# Patient Record
Sex: Female | Born: 1980 | Race: Black or African American | Hispanic: No | Marital: Married | Smoking: Never smoker
Health system: Southern US, Community
[De-identification: ages and names within clinical notes are randomized; demographics above are authoritative.]

## PROBLEM LIST (undated history)

## (undated) ENCOUNTER — Inpatient Hospital Stay (HOSPITAL_COMMUNITY): Payer: Self-pay

## (undated) DIAGNOSIS — Z789 Other specified health status: Secondary | ICD-10-CM

## (undated) HISTORY — PX: NO PAST SURGERIES: SHX2092

## (undated) HISTORY — PX: WISDOM TOOTH EXTRACTION: SHX21

---

## 2002-06-14 ENCOUNTER — Other Ambulatory Visit: Admission: RE | Admit: 2002-06-14 | Discharge: 2002-06-14 | Payer: Self-pay | Admitting: Obstetrics & Gynecology

## 2002-06-16 ENCOUNTER — Encounter: Payer: Self-pay | Admitting: Obstetrics & Gynecology

## 2002-06-16 ENCOUNTER — Ambulatory Visit (HOSPITAL_COMMUNITY): Admission: RE | Admit: 2002-06-16 | Discharge: 2002-06-16 | Payer: Self-pay | Admitting: Obstetrics & Gynecology

## 2002-09-06 ENCOUNTER — Encounter: Payer: Self-pay | Admitting: Obstetrics & Gynecology

## 2002-09-06 ENCOUNTER — Ambulatory Visit (HOSPITAL_COMMUNITY): Admission: RE | Admit: 2002-09-06 | Discharge: 2002-09-06 | Payer: Self-pay | Admitting: Obstetrics & Gynecology

## 2002-11-03 ENCOUNTER — Encounter: Payer: Self-pay | Admitting: Obstetrics

## 2002-11-03 ENCOUNTER — Inpatient Hospital Stay (HOSPITAL_COMMUNITY): Admission: RE | Admit: 2002-11-03 | Discharge: 2002-11-03 | Payer: Self-pay | Admitting: Obstetrics

## 2002-11-06 ENCOUNTER — Inpatient Hospital Stay (HOSPITAL_COMMUNITY): Admission: AD | Admit: 2002-11-06 | Discharge: 2002-11-09 | Payer: Self-pay | Admitting: *Deleted

## 2004-02-17 ENCOUNTER — Ambulatory Visit (HOSPITAL_COMMUNITY): Admission: RE | Admit: 2004-02-17 | Discharge: 2004-02-17 | Payer: Self-pay | Admitting: Obstetrics & Gynecology

## 2004-03-03 ENCOUNTER — Inpatient Hospital Stay (HOSPITAL_COMMUNITY): Admission: AD | Admit: 2004-03-03 | Discharge: 2004-03-04 | Payer: Self-pay | Admitting: Maternal and Fetal Medicine

## 2004-05-11 ENCOUNTER — Ambulatory Visit (HOSPITAL_COMMUNITY): Admission: RE | Admit: 2004-05-11 | Discharge: 2004-05-11 | Payer: Self-pay | Admitting: Obstetrics & Gynecology

## 2004-09-21 ENCOUNTER — Inpatient Hospital Stay (HOSPITAL_COMMUNITY): Admission: AD | Admit: 2004-09-21 | Discharge: 2004-09-23 | Payer: Self-pay | Admitting: Obstetrics & Gynecology

## 2004-09-23 ENCOUNTER — Inpatient Hospital Stay (HOSPITAL_COMMUNITY): Admission: AD | Admit: 2004-09-23 | Discharge: 2004-09-26 | Payer: Self-pay | Admitting: Obstetrics & Gynecology

## 2005-06-17 ENCOUNTER — Ambulatory Visit (HOSPITAL_COMMUNITY): Admission: RE | Admit: 2005-06-17 | Discharge: 2005-06-17 | Payer: Self-pay | Admitting: Obstetrics & Gynecology

## 2005-06-28 ENCOUNTER — Ambulatory Visit (HOSPITAL_COMMUNITY): Admission: RE | Admit: 2005-06-28 | Discharge: 2005-06-28 | Payer: Self-pay | Admitting: Obstetrics & Gynecology

## 2005-09-24 ENCOUNTER — Ambulatory Visit (HOSPITAL_COMMUNITY): Admission: RE | Admit: 2005-09-24 | Discharge: 2005-09-24 | Payer: Self-pay | Admitting: Obstetrics & Gynecology

## 2006-02-22 ENCOUNTER — Inpatient Hospital Stay (HOSPITAL_COMMUNITY): Admission: AD | Admit: 2006-02-22 | Discharge: 2006-02-24 | Payer: Self-pay | Admitting: Obstetrics and Gynecology

## 2006-10-17 ENCOUNTER — Emergency Department (HOSPITAL_COMMUNITY): Admission: EM | Admit: 2006-10-17 | Discharge: 2006-10-17 | Payer: Self-pay | Admitting: Emergency Medicine

## 2011-12-03 NOTE — L&D Delivery Note (Signed)
Delivery Note At 5:42 AM a viable female was delivered via Vaginal, Spontaneous Delivery (Presentation: Left Occiput Anterior).  APGAR: 5, 8; weight .   Placenta status: Intact, Spontaneous.  Cord: 3 vessels with the following complications: None.  Cord pH: none  Anesthesia: None  Episiotomy: None Lacerations: None Suture Repair: none Est. Blood Loss (mL): 300  Mom to postpartum.  Baby to nursery-stable.  HARPER,CHARLES A 10/28/2012, 6:06 AM

## 2012-03-18 ENCOUNTER — Inpatient Hospital Stay (HOSPITAL_COMMUNITY)
Admission: AD | Admit: 2012-03-18 | Discharge: 2012-03-18 | Disposition: A | Discharge status: Home / Self Care | Payer: Medicaid Other | Source: Ambulatory Visit | Attending: Family Medicine | Admitting: Family Medicine

## 2012-03-18 ENCOUNTER — Encounter (HOSPITAL_COMMUNITY): Payer: Self-pay | Admitting: *Deleted

## 2012-03-18 DIAGNOSIS — O210 Mild hyperemesis gravidarum: Secondary | ICD-10-CM | POA: Insufficient documentation

## 2012-03-18 DIAGNOSIS — R112 Nausea with vomiting, unspecified: Secondary | ICD-10-CM

## 2012-03-18 LAB — COMPREHENSIVE METABOLIC PANEL
AST: 14 U/L (ref 0–37)
Albumin: 4 g/dL (ref 3.5–5.2)
Calcium: 10.1 mg/dL (ref 8.4–10.5)
Chloride: 102 mEq/L (ref 96–112)
Creatinine, Ser: 0.77 mg/dL (ref 0.50–1.10)
Sodium: 136 mEq/L (ref 135–145)
Total Bilirubin: 0.9 mg/dL (ref 0.3–1.2)

## 2012-03-18 LAB — CBC
MCV: 84.2 fL (ref 78.0–100.0)
Platelets: 243 10*3/uL (ref 150–400)
RDW: 12.6 % (ref 11.5–15.5)
WBC: 11.2 10*3/uL — ABNORMAL HIGH (ref 4.0–10.5)

## 2012-03-18 LAB — URINALYSIS, ROUTINE W REFLEX MICROSCOPIC
Nitrite: NEGATIVE
Protein, ur: NEGATIVE mg/dL
Urobilinogen, UA: 0.2 mg/dL (ref 0.0–1.0)

## 2012-03-18 LAB — WET PREP, GENITAL
Clue Cells Wet Prep HPF POC: NONE SEEN
Trich, Wet Prep: NONE SEEN

## 2012-03-18 LAB — POCT PREGNANCY, URINE: Preg Test, Ur: POSITIVE — AB

## 2012-03-18 MED ORDER — PROMETHAZINE HCL 25 MG/ML IJ SOLN
25.0000 mg | Freq: Once | INTRAMUSCULAR | Status: AC
  Administered 2012-03-18: 25 mg via INTRAVENOUS
  Filled 2012-03-18: qty 1

## 2012-03-18 MED ORDER — PROMETHAZINE HCL 25 MG PO TABS: 25.0000 mg | ORAL_TABLET | Freq: Four times a day (QID) | ORAL | Status: DC | PRN

## 2012-03-18 MED ORDER — LACTATED RINGERS IV BOLUS (SEPSIS)
1000.0000 mL | Freq: Once | INTRAVENOUS | Status: AC
  Administered 2012-03-18: 1000 mL via INTRAVENOUS

## 2012-03-18 NOTE — MAU Note (Signed)
Pregnancy confirmed at [facility] for medicaid. States vomiting 3 X per day. Spitting into a bottle. States she had vomiting with previous pregnancies. States she noted some blood in vomitus last night. States she has had a scant amount of mucous vaginal discharge with streaks of blood only when she wipes. States she has lost weight. Has not been able to keep anything down today.

## 2012-03-18 NOTE — MAU Provider Note (Signed)
History   Pt presents today c/o N&V that has worsened over the past couple of days. She states she has no longer been able to keep anything on her stomach. She also noticed a streak of blood in her vag dc this am.  CSN: [id number]  Arrival date and time: 03/18/12 1641   None     Chief Complaint  Patient presents with  . Emesis During Pregnancy   HPI  OB History    Grav Para Term Preterm Abortions TAB SAB Ect Mult Living   4 3 3  0 0 0 0 0 0 3      History reviewed. No pertinent past medical history.  Past Surgical History  Procedure Date  . Wisdom tooth extraction     Family History  Problem Relation Age of Onset  . Anesthesia problems Neg Hx     History  Substance Use Topics  . Smoking status: Never Smoker   . Smokeless tobacco: Never Used  . Alcohol Use: No    Allergies: No Known Allergies  No prescriptions prior to admission    Review of Systems  Constitutional: Negative for fever and chills.  Eyes: Negative for blurred vision and double vision.  Respiratory: Negative for cough, hemoptysis, sputum production, shortness of breath and wheezing.   Cardiovascular: Negative for chest pain and palpitations.  Gastrointestinal: Positive for nausea and vomiting. Negative for abdominal pain, diarrhea and constipation.  Genitourinary: Negative for dysuria, urgency, frequency and hematuria.  Neurological: Negative for dizziness and headaches.  Psychiatric/Behavioral: Negative for depression and suicidal ideas.   Physical Exam   Blood pressure 120/64, pulse 58, temperature 98.4 F (36.9 C), temperature source Oral, resp. rate 18, height 5\' 3"  (1.6 m), weight 133 lb (60.328 kg), last menstrual period 01/17/2012.  Physical Exam  Nursing note and vitals reviewed. Constitutional: She is oriented to person, place, and time. She appears well-developed and well-nourished. No distress.  HENT:  Head: Normocephalic and atraumatic.  Eyes: EOM are normal. Pupils are equal,  round, and reactive to light.  Cardiovascular: Normal rate and regular rhythm.  Exam reveals no gallop and no friction rub.   No murmur heard. Respiratory: Effort normal and breath sounds normal. No respiratory distress. She has no wheezes. She has no rales. She exhibits no tenderness.  GI: Soft. She exhibits no distension and no mass. There is no tenderness. There is no rebound and no guarding.  Genitourinary: No bleeding around the vagina. No vaginal discharge found.       Cervix Lg/closed.  Neurological: She is alert and oriented to person, place, and time.  Skin: Skin is warm and dry. She is not diaphoretic.  Psychiatric: She has a normal mood and affect. Her behavior is normal. Judgment and thought content normal.    MAU Course  Procedures  Wet prep and GC/Chlamydia cultures done.  Results for orders placed during the hospital encounter of 03/18/12 (from the past 24 hour(s))  URINALYSIS, ROUTINE W REFLEX MICROSCOPIC     Status: Abnormal   Collection Time   03/18/12  4:50 PM      Component Value Range   Color, Urine YELLOW  YELLOW    APPearance HAZY (*) CLEAR    Specific Gravity, Urine <1.005 (*) 1.005 - 1.030    pH 6.5  5.0 - 8.0    Glucose, UA NEGATIVE  NEGATIVE (mg/dL)   Hgb urine dipstick TRACE (*) NEGATIVE    Bilirubin Urine NEGATIVE  NEGATIVE    Ketones, ur NEGATIVE  NEGATIVE (  mg/dL)   Protein, ur NEGATIVE  NEGATIVE (mg/dL)   Urobilinogen, UA 0.2  0.0 - 1.0 (mg/dL)   Nitrite NEGATIVE  NEGATIVE    Leukocytes, UA NEGATIVE  NEGATIVE   URINE MICROSCOPIC-ADD ON     Status: Abnormal   Collection Time   03/18/12  4:50 PM      Component Value Range   Squamous Epithelial / LPF FEW (*) RARE    WBC, UA 0-2  <3 (WBC/hpf)   RBC / HPF 0-2  <3 (RBC/hpf)   Bacteria, UA FEW (*) RARE   POCT PREGNANCY, URINE     Status: Abnormal   Collection Time   03/18/12  5:21 PM      Component Value Range   Preg Test, Ur POSITIVE (*) NEGATIVE   CBC     Status: Abnormal   Collection Time    03/18/12  5:30 PM      Component Value Range   WBC 11.2 (*) 4.0 - 10.5 (K/uL)   RBC 4.38  3.87 - 5.11 (MIL/uL)   Hemoglobin 13.3  12.0 - 15.0 (g/dL)   HCT 29.9  37.1 - 69.6 (%)   MCV 84.2  78.0 - 100.0 (fL)   MCH 30.4  26.0 - 34.0 (pg)   MCHC 36.0  30.0 - 36.0 (g/dL)   RDW 78.9  38.1 - 01.7 (%)   Platelets 243  150 - 400 (K/uL)  COMPREHENSIVE METABOLIC PANEL     Status: Normal   Collection Time   03/18/12  5:30 PM      Component Value Range   Sodium 136  135 - 145 (mEq/L)   Potassium 3.5  3.5 - 5.1 (mEq/L)   Chloride 102  96 - 112 (mEq/L)   CO2 24  19 - 32 (mEq/L)   Glucose, Bld 85  70 - 99 (mg/dL)   BUN 10  6 - 23 (mg/dL)   Creatinine, Ser 5.10  0.50 - 1.10 (mg/dL)   Calcium 25.8  8.4 - 10.5 (mg/dL)   Total Protein 7.4  6.0 - 8.3 (g/dL)   Albumin 4.0  3.5 - 5.2 (g/dL)   AST 14  0 - 37 (U/L)   ALT 11  0 - 35 (U/L)   Alkaline Phosphatase 74  39 - 117 (U/L)   Total Bilirubin 0.9  0.3 - 1.2 (mg/dL)   GFR calc non Af Amer >90  >90 (mL/min)   GFR calc Af Amer >90  >90 (mL/min)  ABO/RH     Status: Normal (Preliminary result)   Collection Time   03/18/12  5:30 PM      Component Value Range   ABO/RH(D) O POS    WET PREP, GENITAL     Status: Abnormal   Collection Time   03/18/12  6:55 PM      Component Value Range   Yeast Wet Prep HPF POC NONE SEEN  NONE SEEN    Trich, Wet Prep NONE SEEN  NONE SEEN    Clue Cells Wet Prep HPF POC NONE SEEN  NONE SEEN    WBC, Wet Prep HPF POC FEW (*) NONE SEEN    Bedside US shows single IUP with cardiac activity at 8.4wks.  Assessment and Plan  N&V: discussed with pt at length. Will dc to home with phenergan. Discussed diet, activity, risks, and precautions. She will begin prenatal care.  Clinton Gallant. Hser Belanger III, DrHSc, MPAS, PA-C  03/18/2012, 5:31 PM

## 2012-03-19 LAB — GC/CHLAMYDIA PROBE AMP, GENITAL
Chlamydia, DNA Probe: NEGATIVE
GC Probe Amp, Genital: NEGATIVE

## 2012-03-19 NOTE — MAU Provider Note (Signed)
Chart reviewed and agree with management and plan.  

## 2012-04-08 LAB — OB RESULTS CONSOLE ABO/RH: RH Type: POSITIVE

## 2012-04-08 LAB — OB RESULTS CONSOLE RPR: RPR: NONREACTIVE

## 2012-04-08 LAB — OB RESULTS CONSOLE RUBELLA ANTIBODY, IGM: Rubella: IMMUNE

## 2012-04-08 LAB — OB RESULTS CONSOLE HIV ANTIBODY (ROUTINE TESTING): HIV: NONREACTIVE

## 2012-04-08 LAB — OB RESULTS CONSOLE HEPATITIS B SURFACE ANTIGEN: Hepatitis B Surface Ag: NEGATIVE

## 2012-05-06 ENCOUNTER — Other Ambulatory Visit: Payer: Self-pay

## 2012-05-20 ENCOUNTER — Encounter (HOSPITAL_COMMUNITY): Payer: Self-pay | Admitting: Obstetrics

## 2012-05-25 ENCOUNTER — Other Ambulatory Visit: Payer: Self-pay | Admitting: Obstetrics

## 2012-05-25 DIAGNOSIS — Z8279 Family history of other congenital malformations, deformations and chromosomal abnormalities: Secondary | ICD-10-CM

## 2012-05-26 ENCOUNTER — Ambulatory Visit (HOSPITAL_COMMUNITY)
Admission: RE | Admit: 2012-05-26 | Discharge: 2012-05-26 | Disposition: A | Discharge status: Home / Self Care | Payer: Medicaid Other | Source: Ambulatory Visit | Attending: Obstetrics | Admitting: Obstetrics

## 2012-05-26 ENCOUNTER — Encounter (HOSPITAL_COMMUNITY): Payer: Self-pay

## 2012-05-26 DIAGNOSIS — Z8279 Family history of other congenital malformations, deformations and chromosomal abnormalities: Secondary | ICD-10-CM

## 2012-05-26 DIAGNOSIS — Z363 Encounter for antenatal screening for malformations: Secondary | ICD-10-CM | POA: Insufficient documentation

## 2012-05-26 DIAGNOSIS — D573 Sickle-cell trait: Secondary | ICD-10-CM | POA: Insufficient documentation

## 2012-05-26 DIAGNOSIS — Z1389 Encounter for screening for other disorder: Secondary | ICD-10-CM | POA: Insufficient documentation

## 2012-05-26 DIAGNOSIS — O358XX0 Maternal care for other (suspected) fetal abnormality and damage, not applicable or unspecified: Secondary | ICD-10-CM | POA: Insufficient documentation

## 2012-05-26 NOTE — Progress Notes (Signed)
Genetic Counseling  High-Risk Gestation Note  Appointment Date:  05/26/2012 Referred By: [name], MD Date of Birth:  1981-07-28    Pregnancy History: [id number] Estimated Date of Delivery: 10/23/12 Estimated Gestational Age: [redacted]w[redacted]d Attending: Particia Nearing, MD   Tonya Gray and her partner, Mr. Charleen Kirks, were seen for genetic counseling because of hemoglobin electrophoresis which indicated sickle cell trait in the patient and because of the couple's daughter born with a shortened ulna.   Ms. 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 Ms. Sease stated that all three of her children have sickle cell trait, detected from newborn screening, and that she also knew from testing in the past that she has sickle cell trait.   We discussed that sickle cell anemia (SCA) affects the shape and function of the red blood cell by producing abnormal hemoglobin. They were counseled that hemoglobin is a protein in the RBCs that carries oxygen to the body's organs. Individuals who have SCA have a changes within the genes that codes for hemoglobin. This couple was counseled that SCA is inherited in an autosomal recessive manner, and occurs when both copies of the hemoglobin gene are changed and produce an abnormal hemoglobin S. Typically, one abnormal gene for the production of hemoglobin S is inherited from each parent. A carrier of SCA has one altered copy of the gene for hemoglobin and one typical working copy. Carriers of recessive conditions typically do not have symptoms related to the condition because they still have one functioning copy of the gene, and thus some production of the typical protein coded for by that gene. We reviewed that when both parents are carriers, each pregnancy has a 25% chance to inherit SCA, 50% chance to inherit SCT, and a 25% chance to have neither SCA  or SCT.   Given the recessive inheritance, we discussed the importance of understanding Mr. McFarlane's carrier status in order to accurately predict the risk of a hemoglobinopathy in the fetus. Mr. Hubbard Robinson reported that he has not had testing for sickle cell trait in the past to his knowledge. However, he reported that his mother and maternal half-sister were told in the past that they have sickle cell trait. We discussed that prior to screening, Mr. Hubbard Robinson has a 1 in 2 chance to have sickle cell trait based on this reported family history, in which case the risk for SCA in the current pregnancy would be 1 in 8. (We discussed that if there is not a family history of sickle cell trait for Mr. Hubbard Robinson, then he would have the general population risk to carry SCT, prior to screening, in which case risk for SCA in the pregnancy would be approximately 1 in 79.)  Mr. Hubbard Robinson was offered the option of testing via hemoglobin electrophoresis to determine whether he has any hemoglobin variant, including SCT. We discussed that he can have this performed through our office, his primary physician, or [facility] [place], which provides sickle cell screening free of charge.  He declined this testing today, stating that he would likely pursue screening through [facility] [place].   We also reviewed the availability of newborn screening in [place] for hemoglobinopathies. We discussed the option of amniocentesis as a way to determine, prenatally, if a baby has SCA or not, in the case that both parents are identified carriers.  A risk  of 1 in 300-500 was given for amniocentesis, the primary complication being spontaneous pregnancy loss, or at this late gestation, preterm delivery.  The couple stated that they would not be interested in amniocentesis in the pregnancy to assess for SCA. She understands that although the ultrasound may appear normal, the risk of anomalies cannot be  completely eliminated, and that a baby with SCA would not appear different on a prenatal ultrasound.   Both family histories were reviewed and found to also be contributory for the couple's daughter born with her left ulna shortened. Ms. Klee reported that the left radius is growing over the ulna, or vice versa, she could not remember which, which is causing the bone to protrude near her elbow. The couple reported that their daughter has been evaluated at [facility] and at [facility], where she is currently followed. 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 We discussed that congenital bone abnormalities can be isolated or occur as part of an underlying genetic syndrome. Ms. Cumbo reported that no underlying cause was determined. We discussed that without additional information, we cannot accurately assess recurrence risk for relatives. We discussed that targeted ultrasound is available to assess fetal growth and development.  The couple understands that ultrasound cannot rule out all birth defects or genetic conditions. Without further information regarding the provided family history, an accurate genetic risk cannot be calculated. Further genetic counseling is warranted if more information is obtained.   Targeted ultrasound was performed at the time of today's visit. Complete ultrasound results reported separately.   Ms. Vannostrand denied exposure to environmental toxins or chemical agents. She denied the use of alcohol, tobacco or street drugs. She denied significant viral illnesses during the course of her pregnancy. Her medical and surgical histories were noncontributory.   I counseled this couple for approximately 35 minutes regarding the above risks and available options.     Quinn Plowman, MS,  Certified [employer]  05/28/2012

## 2012-05-27 ENCOUNTER — Encounter (HOSPITAL_COMMUNITY): Payer: Self-pay

## 2012-08-05 ENCOUNTER — Ambulatory Visit: Payer: Medicaid Other | Attending: Nurse Practitioner | Admitting: Physical Therapy

## 2012-08-05 DIAGNOSIS — M545 Low back pain, unspecified: Secondary | ICD-10-CM | POA: Insufficient documentation

## 2012-08-05 DIAGNOSIS — IMO0001 Reserved for inherently not codable concepts without codable children: Secondary | ICD-10-CM | POA: Insufficient documentation

## 2012-08-05 DIAGNOSIS — M25559 Pain in unspecified hip: Secondary | ICD-10-CM | POA: Insufficient documentation

## 2012-08-19 ENCOUNTER — Ambulatory Visit: Payer: Medicaid Other | Admitting: Physical Therapy

## 2012-09-02 ENCOUNTER — Ambulatory Visit: Payer: Medicaid Other | Attending: Nurse Practitioner | Admitting: Physical Therapy

## 2012-09-02 DIAGNOSIS — IMO0001 Reserved for inherently not codable concepts without codable children: Secondary | ICD-10-CM | POA: Insufficient documentation

## 2012-09-02 DIAGNOSIS — M545 Low back pain, unspecified: Secondary | ICD-10-CM | POA: Insufficient documentation

## 2012-09-02 DIAGNOSIS — M25559 Pain in unspecified hip: Secondary | ICD-10-CM | POA: Insufficient documentation

## 2012-09-15 ENCOUNTER — Ambulatory Visit: Payer: Medicaid Other | Admitting: Physical Therapy

## 2012-09-16 ENCOUNTER — Ambulatory Visit: Payer: Medicaid Other | Admitting: Physical Therapy

## 2012-09-16 ENCOUNTER — Encounter: Payer: Medicaid Other | Admitting: Physical Therapy

## 2012-10-20 ENCOUNTER — Telehealth (HOSPITAL_COMMUNITY): Payer: Self-pay | Admitting: *Deleted

## 2012-10-20 ENCOUNTER — Encounter (HOSPITAL_COMMUNITY): Payer: Self-pay | Admitting: *Deleted

## 2012-10-20 NOTE — Telephone Encounter (Signed)
Preadmission screen  

## 2012-10-22 ENCOUNTER — Other Ambulatory Visit: Payer: Self-pay | Admitting: Obstetrics & Gynecology

## 2012-10-22 ENCOUNTER — Other Ambulatory Visit: Payer: Self-pay | Admitting: Obstetrics

## 2012-10-22 ENCOUNTER — Telehealth (HOSPITAL_COMMUNITY): Payer: Self-pay | Admitting: *Deleted

## 2012-10-22 NOTE — Telephone Encounter (Signed)
Preadmission screen  

## 2012-10-27 ENCOUNTER — Encounter (HOSPITAL_COMMUNITY): Payer: Self-pay

## 2012-10-27 ENCOUNTER — Inpatient Hospital Stay (HOSPITAL_COMMUNITY)
Admission: RE | Admit: 2012-10-27 | Discharge: 2012-10-30 | DRG: 775 | Disposition: A | Discharge status: Home / Self Care | Payer: Medicaid Other | Source: Ambulatory Visit | Attending: Obstetrics | Admitting: Obstetrics

## 2012-10-27 DIAGNOSIS — D573 Sickle-cell trait: Secondary | ICD-10-CM

## 2012-10-27 DIAGNOSIS — O99892 Other specified diseases and conditions complicating childbirth: Secondary | ICD-10-CM | POA: Diagnosis present

## 2012-10-27 DIAGNOSIS — Z22330 Carrier of Group B streptococcus: Secondary | ICD-10-CM

## 2012-10-27 DIAGNOSIS — O480 Post-term pregnancy: Principal | ICD-10-CM | POA: Diagnosis present

## 2012-10-27 HISTORY — DX: Other specified health status: Z78.9

## 2012-10-27 LAB — CBC
HCT: 33 % — ABNORMAL LOW (ref 36.0–46.0)
MCH: 27.4 pg (ref 26.0–34.0)
MCHC: 34.2 g/dL (ref 30.0–36.0)
MCV: 79.9 fL (ref 78.0–100.0)
RDW: 15.4 % (ref 11.5–15.5)

## 2012-10-27 MED ORDER — LACTATED RINGERS IV SOLN
500.0000 mL | INTRAVENOUS | Status: DC | PRN
  Administered 2012-10-27: 500 mL via INTRAVENOUS

## 2012-10-27 MED ORDER — IBUPROFEN 600 MG PO TABS: 600.0000 mg | ORAL_TABLET | Freq: Four times a day (QID) | ORAL | Status: DC | PRN

## 2012-10-27 MED ORDER — PROMETHAZINE HCL 25 MG/ML IJ SOLN
25.0000 mg | Freq: Four times a day (QID) | INTRAMUSCULAR | Status: DC | PRN
  Administered 2012-10-28: 25 mg via INTRAMUSCULAR
  Filled 2012-10-27: qty 1

## 2012-10-27 MED ORDER — NALBUPHINE SYRINGE 5 MG/0.5 ML
10.0000 mg | INJECTION | Freq: Four times a day (QID) | INTRAMUSCULAR | Status: DC | PRN
  Administered 2012-10-28: 10 mg via INTRAMUSCULAR
  Filled 2012-10-27 (×2): qty 1

## 2012-10-27 MED ORDER — MISOPROSTOL 25 MCG QUARTER TABLET
25.0000 ug | ORAL_TABLET | ORAL | Status: DC | PRN
  Administered 2012-10-27: 25 ug via VAGINAL
  Filled 2012-10-27: qty 1

## 2012-10-27 MED ORDER — ZOLPIDEM TARTRATE 5 MG PO TABS
5.0000 mg | ORAL_TABLET | Freq: Every evening | ORAL | Status: DC | PRN
Start: 2012-10-27 — End: 2012-10-28
  Administered 2012-10-28: 5 mg via ORAL
  Filled 2012-10-27: qty 1

## 2012-10-27 MED ORDER — ACETAMINOPHEN 325 MG PO TABS: 650.0000 mg | ORAL_TABLET | ORAL | Status: DC | PRN

## 2012-10-27 MED ORDER — OXYTOCIN 40 UNITS IN LACTATED RINGERS INFUSION - SIMPLE MED
62.5000 mL/h | INTRAVENOUS | Status: DC
  Administered 2012-10-28: 62.5 mL/h via INTRAVENOUS
  Filled 2012-10-27: qty 1000

## 2012-10-27 MED ORDER — OXYTOCIN BOLUS FROM INFUSION
500.0000 mL | INTRAVENOUS | Status: DC
  Administered 2012-10-28: 500 mL via INTRAVENOUS

## 2012-10-27 MED ORDER — PENICILLIN G POTASSIUM 5000000 UNITS IJ SOLR
2.5000 10*6.[IU] | INTRAVENOUS | Status: DC
  Administered 2012-10-28: 2.5 10*6.[IU] via INTRAVENOUS
  Filled 2012-10-27 (×5): qty 2.5

## 2012-10-27 MED ORDER — LACTATED RINGERS IV SOLN
INTRAVENOUS | Status: DC
  Administered 2012-10-27: 20:00:00 via INTRAVENOUS

## 2012-10-27 MED ORDER — CITRIC ACID-SODIUM CITRATE 334-500 MG/5ML PO SOLN: 30.0000 mL | ORAL | Status: DC | PRN

## 2012-10-27 MED ORDER — ONDANSETRON HCL 4 MG/2ML IJ SOLN: 4.0000 mg | Freq: Four times a day (QID) | INTRAMUSCULAR | Status: DC | PRN

## 2012-10-27 MED ORDER — OXYCODONE-ACETAMINOPHEN 5-325 MG PO TABS: 1.0000 | ORAL_TABLET | ORAL | Status: DC | PRN

## 2012-10-27 MED ORDER — NALBUPHINE SYRINGE 5 MG/0.5 ML
10.0000 mg | INJECTION | INTRAMUSCULAR | Status: DC | PRN
  Administered 2012-10-28: 10 mg via INTRAVENOUS
  Filled 2012-10-27: qty 1

## 2012-10-27 MED ORDER — LIDOCAINE HCL (PF) 1 % IJ SOLN
30.0000 mL | INTRAMUSCULAR | Status: DC | PRN
  Filled 2012-10-27: qty 30

## 2012-10-27 MED ORDER — FLEET ENEMA 7-19 GM/118ML RE ENEM: 1.0000 | ENEMA | RECTAL | Status: DC | PRN

## 2012-10-27 MED ORDER — PENICILLIN G POTASSIUM 5000000 UNITS IJ SOLR
5.0000 10*6.[IU] | Freq: Once | INTRAVENOUS | Status: AC
  Administered 2012-10-27: 5 10*6.[IU] via INTRAVENOUS
  Filled 2012-10-27: qty 5

## 2012-10-27 NOTE — H&P (Signed)
Tonya Gray is a 31 y.o. female presenting for IOL. Maternal Medical History:  Reason for admission: 31 yo G4 P3.  EDC 10-23-12.  Presents for IOL for postdates.  Fetal activity: Perceived fetal activity is normal.   Last perceived fetal movement was within the past hour.    Prenatal Complications - Diabetes: none.    OB History    Grav Para Term Preterm Abortions TAB SAB Ect Mult Living   4 3 3  0 0 0 0 0 0 3     Past Medical History  Diagnosis Date  . No pertinent past medical history    Past Surgical History  Procedure Date  . Wisdom tooth extraction    Family History: family history includes Other in her daughter and Sickle cell trait in her daughters and son.  There is no history of Anesthesia problems. Social History:  reports that she has never smoked. She has never used smokeless tobacco. She reports that she does not drink alcohol or use illicit drugs.   Prenatal Transfer Tool  Maternal Diabetes: No Genetic Screening: Normal Maternal Ultrasounds/Referrals: Normal Fetal Ultrasounds or other Referrals:  None Maternal Substance Abuse:  No Significant Maternal Medications:  Meds include: Other: PNV Significant Maternal Lab Results:  Lab values include: Group B Strep negative Other Comments:  None  Review of Systems  All other systems reviewed and are negative.    Dilation: 1 Effacement (%): 50 Station: -3 Exam by:: ansah-mensah, rnc Blood pressure 138/76, pulse 77, temperature 98.2 F (36.8 C), temperature source Oral, resp. rate 20, height 5\' 2"  (1.575 m), weight 168 lb (76.204 kg), last menstrual period 01/17/2012. Maternal Exam:  Abdomen: Patient reports no abdominal tenderness. Fetal presentation: vertex  Introitus: Normal vulva. Normal vagina.    Physical Exam  Nursing note and vitals reviewed. Constitutional: She is oriented to person, place, and time. She appears well-developed and well-nourished.  HENT:  Head: Normocephalic and atraumatic.    Eyes: Conjunctivae normal are normal. Pupils are equal, round, and reactive to light.  Neck: Normal range of motion. Neck supple.  Cardiovascular: Normal rate and regular rhythm.   Respiratory: Effort normal.  GI: Soft.  Genitourinary: Vagina normal and uterus normal.  Musculoskeletal: Normal range of motion.  Neurological: She is alert and oriented to person, place, and time.  Skin: Skin is warm and dry.  Psychiatric: She has a normal mood and affect. Her behavior is normal. Judgment and thought content normal.    Prenatal labs: ABO, Rh: O/Positive/-- (05/08 0000) Antibody: Negative (05/08 0000) Rubella: Immune (05/08 0000) RPR: Nonreactive (05/08 0000)  HBsAg: Negative (05/08 0000)  HIV: Non-reactive (05/08 0000)  GBS: Positive (10/24 0000)   Assessment/Plan: 40.4 weeks.  Postdates.  2 stage IOL.   Tonya Gray A 10/27/2012, 10:14 PM

## 2012-10-28 ENCOUNTER — Encounter (HOSPITAL_COMMUNITY): Payer: Self-pay

## 2012-10-28 MED ORDER — DIPHENHYDRAMINE HCL 25 MG PO CAPS: 25.0000 mg | ORAL_CAPSULE | Freq: Four times a day (QID) | ORAL | Status: DC | PRN

## 2012-10-28 MED ORDER — LACTATED RINGERS IV SOLN: 500.0000 mL | Freq: Once | INTRAVENOUS | Status: DC

## 2012-10-28 MED ORDER — DIBUCAINE 1 % RE OINT: 1.0000 "application " | TOPICAL_OINTMENT | RECTAL | Status: DC | PRN

## 2012-10-28 MED ORDER — SENNOSIDES-DOCUSATE SODIUM 8.6-50 MG PO TABS
2.0000 | ORAL_TABLET | Freq: Every day | ORAL | Status: DC
  Administered 2012-10-28 – 2012-10-30 (×2): 2 via ORAL

## 2012-10-28 MED ORDER — EPHEDRINE 5 MG/ML INJ: 10.0000 mg | INTRAVENOUS | Status: DC | PRN

## 2012-10-28 MED ORDER — TETANUS-DIPHTH-ACELL PERTUSSIS 5-2.5-18.5 LF-MCG/0.5 IM SUSP: 0.5000 mL | Freq: Once | INTRAMUSCULAR | Status: DC

## 2012-10-28 MED ORDER — EPHEDRINE 5 MG/ML INJ
10.0000 mg | INTRAVENOUS | Status: DC | PRN
  Filled 2012-10-28: qty 4

## 2012-10-28 MED ORDER — PHENYLEPHRINE 40 MCG/ML (10ML) SYRINGE FOR IV PUSH (FOR BLOOD PRESSURE SUPPORT)
80.0000 ug | PREFILLED_SYRINGE | INTRAVENOUS | Status: DC | PRN
  Filled 2012-10-28: qty 5

## 2012-10-28 MED ORDER — PRENATAL MULTIVITAMIN CH
1.0000 | ORAL_TABLET | Freq: Every day | ORAL | Status: DC
  Administered 2012-10-28 – 2012-10-29 (×2): 1 via ORAL
  Filled 2012-10-28 (×3): qty 1

## 2012-10-28 MED ORDER — SIMETHICONE 80 MG PO CHEW: 80.0000 mg | CHEWABLE_TABLET | ORAL | Status: DC | PRN

## 2012-10-28 MED ORDER — LANOLIN HYDROUS EX OINT: TOPICAL_OINTMENT | CUTANEOUS | Status: DC | PRN

## 2012-10-28 MED ORDER — ONDANSETRON HCL 4 MG PO TABS: 4.0000 mg | ORAL_TABLET | ORAL | Status: DC | PRN

## 2012-10-28 MED ORDER — FENTANYL 2.5 MCG/ML BUPIVACAINE 1/10 % EPIDURAL INFUSION (WH - ANES)
14.0000 mL/h | INTRAMUSCULAR | Status: DC
  Filled 2012-10-28: qty 125

## 2012-10-28 MED ORDER — BENZOCAINE-MENTHOL 20-0.5 % EX AERO: 1.0000 "application " | INHALATION_SPRAY | CUTANEOUS | Status: DC | PRN

## 2012-10-28 MED ORDER — WITCH HAZEL-GLYCERIN EX PADS: 1.0000 "application " | MEDICATED_PAD | CUTANEOUS | Status: DC | PRN

## 2012-10-28 MED ORDER — IBUPROFEN 600 MG PO TABS
600.0000 mg | ORAL_TABLET | Freq: Four times a day (QID) | ORAL | Status: DC
  Administered 2012-10-28 – 2012-10-30 (×8): 600 mg via ORAL
  Filled 2012-10-28 (×8): qty 1

## 2012-10-28 MED ORDER — DIPHENHYDRAMINE HCL 50 MG/ML IJ SOLN: 12.5000 mg | INTRAMUSCULAR | Status: DC | PRN

## 2012-10-28 MED ORDER — OXYCODONE-ACETAMINOPHEN 5-325 MG PO TABS: 1.0000 | ORAL_TABLET | ORAL | Status: DC | PRN

## 2012-10-28 MED ORDER — ONDANSETRON HCL 4 MG/2ML IJ SOLN: 4.0000 mg | INTRAMUSCULAR | Status: DC | PRN

## 2012-10-28 MED ORDER — ZOLPIDEM TARTRATE 5 MG PO TABS: 5.0000 mg | ORAL_TABLET | Freq: Every evening | ORAL | Status: DC | PRN

## 2012-10-28 MED ORDER — PHENYLEPHRINE 40 MCG/ML (10ML) SYRINGE FOR IV PUSH (FOR BLOOD PRESSURE SUPPORT): 80.0000 ug | PREFILLED_SYRINGE | INTRAVENOUS | Status: DC | PRN

## 2012-10-28 MED ORDER — OXYTOCIN 40 UNITS IN LACTATED RINGERS INFUSION - SIMPLE MED: 62.5000 mL/h | INTRAVENOUS | Status: DC | PRN

## 2012-10-28 NOTE — Progress Notes (Signed)
Tonya Gray is a 31 y.o. [phone] at 105w5d by LMP admitted for induction of labor due to Post dates. Due date 10-23-12.  Subjective:   Objective: BP 131/68  Pulse 75  Temp 98.2 F (36.8 C) (Oral)  Resp 20  Ht 5\' 2"  (1.575 m)  Wt 168 lb (76.204 kg)  BMI 30.73 kg/m2  LMP 01/17/2012      FHT:  FHR: 150 bpm, variability: moderate,  accelerations:  Present,  decelerations:  Present variable UC:   regular, every 2 minutes SVE:   Dilation: 10 Effacement (%): 100 Station: +2 Exam by:: ansah-mensah, rnc  Labs: Lab Results  Component Value Date   WBC 11.9* 10/27/2012   HGB 11.3* 10/27/2012   HCT 33.0* 10/27/2012   MCV 79.9 10/27/2012   PLT 264 10/27/2012    Assessment / Plan: Postdates.  Active labor.  Expectant.  Labor: Progressing normally Preeclampsia:  n/a Fetal Wellbeing:  Category I Pain Control:  Nubain I/D:  n/a Anticipated MOD:  NSVD  Tonya Gray A 10/28/2012, 6:02 AM

## 2012-10-28 NOTE — Progress Notes (Signed)
UR chart review completed.  

## 2012-10-29 LAB — CBC
MCH: 27.6 pg (ref 26.0–34.0)
MCHC: 34.1 g/dL (ref 30.0–36.0)
Platelets: 216 10*3/uL (ref 150–400)

## 2012-10-29 NOTE — Progress Notes (Signed)
Post Partum Day 1 Subjective: no complaints  Objective: Blood pressure 110/61, pulse 68, temperature 98.1 F (36.7 C), temperature source Oral, resp. rate 20, height 5\' 2"  (1.575 m), weight 168 lb (76.204 kg), last menstrual period 01/17/2012, unknown if currently breastfeeding.  Physical Exam:  General: alert and no distress Lochia: appropriate Uterine Fundus: firm Incision: n/a DVT Evaluation: No evidence of DVT seen on physical exam.   Basename 10/29/12 0510 10/27/12 2000  HGB 10.6* 11.3*  HCT 31.1* 33.0*    Assessment/Plan: Plan for discharge tomorrow   LOS: 2 days   HARPER,CHARLES A 10/29/2012, 10:50 AM

## 2012-10-30 MED ORDER — IBUPROFEN 600 MG PO TABS: 600.0000 mg | ORAL_TABLET | Freq: Four times a day (QID) | ORAL | Status: DC | PRN

## 2012-10-30 MED ORDER — OXYCODONE-ACETAMINOPHEN 5-325 MG PO TABS: 1.0000 | ORAL_TABLET | ORAL | Status: DC | PRN

## 2012-10-30 NOTE — Progress Notes (Signed)
Post Partum Day 2 Subjective: no complaints  Objective: Blood pressure 120/76, pulse 66, temperature 98 F (36.7 C), temperature source Oral, resp. rate 18, height 5\' 2"  (1.575 m), weight 168 lb (76.204 kg), last menstrual period 01/17/2012, unknown if currently breastfeeding.  Physical Exam:  General: alert and no distress Lochia: appropriate Uterine Fundus: firm Incision: none DVT Evaluation: No evidence of DVT seen on physical exam.   Basename 10/29/12 0510 10/27/12 2000  HGB 10.6* 11.3*  HCT 31.1* 33.0*    Assessment/Plan: Discharge home   LOS: 3 days   Yury Schaus A 10/30/2012, 8:56 AM

## 2012-10-30 NOTE — Discharge Summary (Signed)
Obstetric Discharge Summary Reason for Admission: onset of labor Prenatal Procedures: ultrasound Intrapartum Procedures: spontaneous vaginal delivery Postpartum Procedures: none Complications-Operative and Postpartum: none Hemoglobin  Date Value Range Status  10/29/2012 10.6* 12.0 - 15.0 g/dL Final     HCT  Date Value Range Status  10/29/2012 31.1* 36.0 - 46.0 % Final    Physical Exam:  General: alert and no distress Lochia: appropriate Uterine Fundus: firm Incision: none DVT Evaluation: No evidence of DVT seen on physical exam.  Discharge Diagnoses: Term Pregnancy-delivered  Discharge Information: Date: 10/30/2012 Activity: pelvic rest Diet: routine Medications: PNV, Ibuprofen, Colace and Percocet Condition: stable Instructions: refer to practice specific booklet Discharge to: home Follow-up Information    Follow up with HARPER,CHARLES A, MD. Schedule an appointment as soon as possible for a visit in 6 weeks.   Contact information:   [address] [place] [address] [phone]          Newborn Data: Live born female  Birth Weight: 7 lb 2.6 oz (3249 g) APGAR: 5, 8  Home with mother.  HARPER,CHARLES A 10/30/2012, 9:03 AM

## 2014-02-11 ENCOUNTER — Inpatient Hospital Stay (HOSPITAL_COMMUNITY)
Admission: AD | Admit: 2014-02-11 | Discharge: 2014-02-11 | Disposition: A | Discharge status: Home / Self Care | Payer: Medicaid Other | Source: Ambulatory Visit | Attending: Obstetrics | Admitting: Obstetrics

## 2014-02-11 ENCOUNTER — Inpatient Hospital Stay (HOSPITAL_COMMUNITY): Payer: Medicaid Other

## 2014-02-11 ENCOUNTER — Encounter (HOSPITAL_COMMUNITY): Payer: Self-pay

## 2014-02-11 DIAGNOSIS — O418X10 Other specified disorders of amniotic fluid and membranes, first trimester, not applicable or unspecified: Secondary | ICD-10-CM

## 2014-02-11 DIAGNOSIS — O43899 Other placental disorders, unspecified trimester: Secondary | ICD-10-CM

## 2014-02-11 DIAGNOSIS — O368990 Maternal care for other specified fetal problems, unspecified trimester, not applicable or unspecified: Secondary | ICD-10-CM | POA: Insufficient documentation

## 2014-02-11 DIAGNOSIS — O468X1 Other antepartum hemorrhage, first trimester: Secondary | ICD-10-CM

## 2014-02-11 DIAGNOSIS — O209 Hemorrhage in early pregnancy, unspecified: Secondary | ICD-10-CM | POA: Insufficient documentation

## 2014-02-11 LAB — URINALYSIS, ROUTINE W REFLEX MICROSCOPIC
BILIRUBIN URINE: NEGATIVE
GLUCOSE, UA: NEGATIVE mg/dL
Ketones, ur: 40 mg/dL — AB
Leukocytes, UA: NEGATIVE
Nitrite: NEGATIVE
PH: 6 (ref 5.0–8.0)
Protein, ur: NEGATIVE mg/dL
SPECIFIC GRAVITY, URINE: 1.02 (ref 1.005–1.030)
UROBILINOGEN UA: 0.2 mg/dL (ref 0.0–1.0)

## 2014-02-11 LAB — CBC
HEMATOCRIT: 36 % (ref 36.0–46.0)
Hemoglobin: 13 g/dL (ref 12.0–15.0)
MCH: 30.2 pg (ref 26.0–34.0)
MCHC: 36.1 g/dL — ABNORMAL HIGH (ref 30.0–36.0)
MCV: 83.5 fL (ref 78.0–100.0)
Platelets: 253 10*3/uL (ref 150–400)
RBC: 4.31 MIL/uL (ref 3.87–5.11)
RDW: 13.4 % (ref 11.5–15.5)
WBC: 11.5 10*3/uL — AB (ref 4.0–10.5)

## 2014-02-11 LAB — URINE MICROSCOPIC-ADD ON

## 2014-02-11 LAB — POCT PREGNANCY, URINE: PREG TEST UR: POSITIVE — AB

## 2014-02-11 LAB — WET PREP, GENITAL
Clue Cells Wet Prep HPF POC: NONE SEEN
Trich, Wet Prep: NONE SEEN
WBC, Wet Prep HPF POC: NONE SEEN
Yeast Wet Prep HPF POC: NONE SEEN

## 2014-02-11 LAB — HCG, QUANTITATIVE, PREGNANCY: HCG, BETA CHAIN, QUANT, S: 130431 m[IU]/mL — AB (ref <5)

## 2014-02-11 NOTE — MAU Provider Note (Signed)
History     CSN: [id number]  Arrival date and time: 02/11/14 1716   None     Chief Complaint  Patient presents with  . Vaginal Bleeding  . Threatened Miscarriage   Vaginal Bleeding    Tonya Gray is a 33 y.o. [id number] at [redacted]w[redacted]d who presents today with vaginal bleeding. She states that around 1630 she felt a gush of blood, and saw that there was some tissue or something in the blood, and the bleeding has continued since then.  She states that saved it for [place] to see. She denies any pain at this time. She is planning on starting [facility] with [facility] and has an appointment scheduled.   Past Medical History  Diagnosis Date  . No pertinent past medical history     Past Surgical History  Procedure Laterality Date  . Wisdom tooth extraction      Family History  Problem Relation Age of Onset  . Anesthesia problems Neg Hx   . Sickle cell trait Daughter   . Other Daughter     shortened ulna  . Sickle cell trait Son   . Sickle cell trait Daughter     History  Substance Use Topics  . Smoking status: Never Smoker   . Smokeless tobacco: Never Used  . Alcohol Use: No    Allergies: No Known Allergies  Prescriptions prior to admission  Medication Sig Dispense Refill  . clobetasol cream (TEMOVATE) 0.05 % Apply 1 application topically 2 (two) times daily. Applied to vaginal area      . HYDROcodone-acetaminophen (NORCO/VICODIN) 5-325 MG per tablet Take 1 tablet by mouth every 6 (six) hours as needed. For pain      . ibuprofen (ADVIL,MOTRIN) 600 MG tablet Take 1 tablet (600 mg total) by mouth every 6 (six) hours as needed for pain.  30 tablet  5  . oxyCODONE-acetaminophen (PERCOCET/ROXICET) 5-325 MG per tablet Take 1-2 tablets by mouth every 4 (four) hours as needed for pain (moderate - severe pain).  40 tablet  0  . Prenatal Vit-Fe Fumarate-FA (PRENATAL MULTIVITAMIN) TABS Take 1 tablet by mouth daily.        Review of Systems  Genitourinary: Positive for vaginal bleeding.    Physical Exam   Blood pressure 118/61, pulse 78, temperature 98.5 F (36.9 C), temperature source Oral, resp. rate 18, height 5' 3.25" (1.607 m), weight 62.596 kg (138 lb), last menstrual period 12/05/2013, unknown if currently breastfeeding.  Physical Exam  Nursing note and vitals reviewed. Constitutional: She is oriented to person, place, and time. She appears well-developed and well-nourished. No distress.  Cardiovascular: Normal rate.   Respiratory: Effort normal.  GI: Soft. There is no tenderness.  Genitourinary:   External: no lesion Vagina: small amount of blood seen. No tissue seen.  Cervix: pink, smooth, no CMT    Neurological: She is alert and oriented to person, place, and time.  Skin: Skin is warm and dry.  Psychiatric: She has a normal mood and affect.    MAU Course  Procedures  Results for orders placed during the hospital encounter of 02/11/14 (from the past 24 hour(s))  URINALYSIS, ROUTINE W REFLEX MICROSCOPIC     Status: Abnormal   Collection Time    02/11/14  5:27 PM      Result Value Ref Range   Color, Urine AMBER (*) YELLOW   APPearance HAZY (*) CLEAR   Specific Gravity, Urine 1.020  1.005 - 1.030   pH 6.0  5.0 - 8.0  Glucose, UA NEGATIVE  NEGATIVE mg/dL   Hgb urine dipstick LARGE (*) NEGATIVE   Bilirubin Urine NEGATIVE  NEGATIVE   Ketones, ur 40 (*) NEGATIVE mg/dL   Protein, ur NEGATIVE  NEGATIVE mg/dL   Urobilinogen, UA 0.2  0.0 - 1.0 mg/dL   Nitrite NEGATIVE  NEGATIVE   Leukocytes, UA NEGATIVE  NEGATIVE  URINE MICROSCOPIC-ADD ON     Status: Abnormal   Collection Time    02/11/14  5:27 PM      Result Value Ref Range   Squamous Epithelial / LPF FEW (*) RARE   RBC / HPF 21-50  <3 RBC/hpf  POCT PREGNANCY, URINE     Status: Abnormal   Collection Time    02/11/14  5:37 PM      Result Value Ref Range   Preg Test, Ur POSITIVE (*) NEGATIVE  CBC     Status: Abnormal   Collection Time    02/11/14  5:42 PM      Result Value Ref Range   WBC  11.5 (*) 4.0 - 10.5 K/uL   RBC 4.31  3.87 - 5.11 MIL/uL   Hemoglobin 13.0  12.0 - 15.0 g/dL   HCT 91.4  78.2 - 95.6 %   MCV 83.5  78.0 - 100.0 fL   MCH 30.2  26.0 - 34.0 pg   MCHC 36.1 (*) 30.0 - 36.0 g/dL   RDW 21.3  08.6 - 57.8 %   Platelets 253  150 - 400 K/uL  HCG, QUANTITATIVE, PREGNANCY     Status: Abnormal   Collection Time    02/11/14  6:00 PM      Result Value Ref Range   hCG, Beta Chain, Quant, Kathie Rhodes [id number] (*) <5 mIU/mL  WET PREP, GENITAL     Status: None   Collection Time    02/11/14  6:42 PM      Result Value Ref Range   Yeast Wet Prep HPF POC NONE SEEN  NONE SEEN   Trich, Wet Prep NONE SEEN  NONE SEEN   Clue Cells Wet Prep HPF POC NONE SEEN  NONE SEEN   WBC, Wet Prep HPF POC NONE SEEN  NONE SEEN   US Ob Comp Less 14 Wks  02/11/2014   CLINICAL DATA:  Vaginal bleeding, unsure dates. Positive pregnancy test.  EXAM: OBSTETRIC <14 WK ULTRASOUND  TECHNIQUE: Transabdominal ultrasound was performed for evaluation of the gestation as well as the maternal uterus and adnexal regions.  COMPARISON:  None for this pregnancy  FINDINGS: Intrauterine gestational sac: Visualized/normal in shape.  Yolk sac:  Not visualized  Embryo:  Visualized  Cardiac Activity: Visualized  Heart Rate: 172 bpm  CRL:   29  mm   9 w 5d                  [place] EDC: 09/11/14  Maternal uterus/adnexae: Ovaries are unremarkable. Trace subchorionic hemorrhage.  IMPRESSION: Single live intrauterine gestation measuring 9 weeks 5 days by today's exam which is concordant with assigned gestational age of same date by LMP. Trace subchorionic hemorrhage.   Electronically Signed   By: Christiana Pellant M.D.   On: 02/11/2014 19:39     Assessment and Plan   1. Subchorionic hematoma in first trimester, antepartum    Bleeding precautions First trimester danger signs  Return to MAU as needed  Follow-up Information   Follow up with [facility]. (As scheduled)    Specialty:  Obstetrics and Gynecology   Contact  information:  [address], [address] [place] [address] [phone]       Tawnya CrookHogan, Heather Donovan 02/11/2014, 6:53 PM

## 2014-02-11 NOTE — MAU Note (Signed)
About an hour ago, felt something- was bleeding, felt something fall out (brought it with her). Continues to bleed.  First appt is for 03/31- had not been seen yet.

## 2014-02-11 NOTE — MAU Note (Signed)
Pt states was sitting on sofa and randomly felt gush of fluid, felt something come out, brought specimen with her. Was bleeding at last check however noted moreso post voiding.

## 2014-02-11 NOTE — Discharge Instructions (Signed)
Subchorionic Hematoma A subchorionic hematoma is a gathering of blood between the outer wall of the placenta and the inner wall of the womb (uterus). The placenta is the organ that connects the fetus to the wall of the uterus. The placenta performs the feeding, breathing (oxygen to the fetus), and waste removal (excretory work) of the fetus.  Subchorionic hematoma is the most common abnormality found on a result from ultrasonography done during the first trimester or early second trimester of pregnancy. If there has been little or no vaginal bleeding, early small hematomas usually shrink on their own and do not affect your baby or pregnancy. The blood is gradually absorbed over 1 2 weeks. When bleeding starts later in pregnancy or the hematoma is larger or occurs in an older pregnant woman, the outcome may not be as good. Larger hematomas may get bigger, which increases the chances for miscarriage. Subchorionic hematoma also increases the risk of premature detachment of the placenta from the uterus, preterm (premature) labor, and stillbirth. HOME CARE INSTRUCTIONS   Stay on bed rest if your health care provider recommends this. Although bed rest will not prevent more bleeding or prevent a miscarriage, your health care provider may recommend bed rest until you are advised otherwise.  Avoid heavy lifting (more than 10 lb [4.5 kg]), exercise, sexual intercourse, or douching as directed by your health care provider.  Keep track of the number of pads you use each day and how soaked (saturated) they are. Write down this information.  Do not use tampons.  Keep all follow-up appointments as directed by your health care provider. Your health care provider may ask you to have follow-up blood tests or ultrasound tests or both. SEEK IMMEDIATE MEDICAL CARE IF:   You have severe cramps in your stomach, back, abdomen, or pelvis.  You have a fever.  You pass large clots or tissue. Save any tissue for your health  care provider to look at.  Your bleeding increases or you become lightheaded, feel weak, or have fainting episodes. Document Released: 03/05/2007 Document Revised: 09/08/2013 Document Reviewed: 06/17/2013 [facility] Patient Information 2014 [place], [place].  Subchorionic Hematoma A subchorionic hematoma is a gathering of blood between the outer wall of the placenta and the inner wall of the womb (uterus). The placenta is the organ that connects the fetus to the wall of the uterus. The placenta performs the feeding, breathing (oxygen to the fetus), and waste removal (excretory work) of the fetus.  Subchorionic hematoma is the most common abnormality found on a result from ultrasonography done during the first trimester or early second trimester of pregnancy. If there has been little or no vaginal bleeding, early small hematomas usually shrink on their own and do not affect your baby or pregnancy. The blood is gradually absorbed over 1 2 weeks. When bleeding starts later in pregnancy or the hematoma is larger or occurs in an older pregnant woman, the outcome may not be as good. Larger hematomas may get bigger, which increases the chances for miscarriage. Subchorionic hematoma also increases the risk of premature detachment of the placenta from the uterus, preterm (premature) labor, and stillbirth. HOME CARE INSTRUCTIONS   Stay on bed rest if your health care provider recommends this. Although bed rest will not prevent more bleeding or prevent a miscarriage, your health care provider may recommend bed rest until you are advised otherwise.  Avoid heavy lifting (more than 10 lb [4.5 kg]), exercise, sexual intercourse, or douching as directed by your health care provider.  Keep track of the number of pads you use each day and how soaked (saturated) they are. Write down this information.  Do not use tampons.  Keep all follow-up appointments as directed by your health care provider. Your health care  provider may ask you to have follow-up blood tests or ultrasound tests or both. SEEK IMMEDIATE MEDICAL CARE IF:   You have severe cramps in your stomach, back, abdomen, or pelvis.  You have a fever.  You pass large clots or tissue. Save any tissue for your health care provider to look at.  Your bleeding increases or you become lightheaded, feel weak, or have fainting episodes. Document Released: 03/05/2007 Document Revised: 09/08/2013 Document Reviewed: 06/17/2013 [facility] Patient Information 2014 [place], [place].

## 2014-02-12 LAB — GC/CHLAMYDIA PROBE AMP
CT Probe RNA: NEGATIVE
GC PROBE AMP APTIMA: NEGATIVE

## 2014-02-22 ENCOUNTER — Encounter (HOSPITAL_COMMUNITY): Payer: Self-pay

## 2014-02-22 ENCOUNTER — Inpatient Hospital Stay (HOSPITAL_COMMUNITY)
Admission: AD | Admit: 2014-02-22 | Discharge: 2014-02-23 | Disposition: A | Discharge status: Home / Self Care | Payer: Medicaid Other | Source: Ambulatory Visit | Attending: Obstetrics | Admitting: Obstetrics

## 2014-02-22 DIAGNOSIS — O210 Mild hyperemesis gravidarum: Secondary | ICD-10-CM | POA: Insufficient documentation

## 2014-02-22 DIAGNOSIS — O418X10 Other specified disorders of amniotic fluid and membranes, first trimester, not applicable or unspecified: Secondary | ICD-10-CM

## 2014-02-22 DIAGNOSIS — O219 Vomiting of pregnancy, unspecified: Secondary | ICD-10-CM

## 2014-02-22 DIAGNOSIS — O468X1 Other antepartum hemorrhage, first trimester: Secondary | ICD-10-CM

## 2014-02-22 DIAGNOSIS — R109 Unspecified abdominal pain: Secondary | ICD-10-CM | POA: Insufficient documentation

## 2014-02-22 DIAGNOSIS — O43899 Other placental disorders, unspecified trimester: Secondary | ICD-10-CM

## 2014-02-22 DIAGNOSIS — O209 Hemorrhage in early pregnancy, unspecified: Secondary | ICD-10-CM | POA: Insufficient documentation

## 2014-02-22 HISTORY — DX: Other specified health status: Z78.9

## 2014-02-22 LAB — URINALYSIS, ROUTINE W REFLEX MICROSCOPIC
BILIRUBIN URINE: NEGATIVE
Glucose, UA: NEGATIVE mg/dL
Hgb urine dipstick: NEGATIVE
Ketones, ur: NEGATIVE mg/dL
LEUKOCYTES UA: NEGATIVE
NITRITE: NEGATIVE
Protein, ur: NEGATIVE mg/dL
SPECIFIC GRAVITY, URINE: 1.02 (ref 1.005–1.030)
Urobilinogen, UA: 0.2 mg/dL (ref 0.0–1.0)
pH: 6 (ref 5.0–8.0)

## 2014-02-22 NOTE — MAU Provider Note (Signed)
History     CSN: [id number]  Arrival date and time: 02/22/14 2159   First Provider Initiated Contact with Patient 02/22/14 2252      Chief Complaint  Patient presents with  . Vaginal Bleeding   HPI [name] is a 33 y.o. [id number] at [redacted]w[redacted]d who presents to MAU today with complaint of vaginal bleeding. She states that she has had vaginal bleeding since her last visit to MAU. At that time US showed a small subchorionic hemorrhage. She states that the bleeding became less and darker and was only noted with wiping. Earlier today it became red again and was slightly heavier, but has improved significantly since then. She is not wearing a pad upon arrival in MAU. She has mild lower abdominal cramping occasionally. She is also having a mucus discharge. She states frequent N/V and a ~ 10 lb weight loss per patient. She denies UTI symptoms or fever. She is not taking anything for nausea at this time.   OB History   Grav Para Term Preterm Abortions TAB SAB Ect Mult Living   5 4 4  0 0 0 0 0 0 4      Past Medical History  Diagnosis Date  . No pertinent past medical history   . Medical history non-contributory     Past Surgical History  Procedure Laterality Date  . Wisdom tooth extraction    . No past surgeries      Family History  Problem Relation Age of Onset  . Anesthesia problems Neg Hx   . Sickle cell trait Daughter   . Other Daughter     shortened ulna  . Sickle cell trait Son   . Sickle cell trait Daughter   . Hypertension Mother   . Diabetes Mother     History  Substance Use Topics  . Smoking status: Never Smoker   . Smokeless tobacco: Never Used  . Alcohol Use: No    Allergies: No Known Allergies  No prescriptions prior to admission    Review of Systems  Constitutional: Negative for fever and malaise/fatigue.  Gastrointestinal: Positive for heartburn, nausea, vomiting and abdominal pain. Negative for diarrhea and constipation.  Genitourinary: Negative  for dysuria, urgency and frequency.       + vaginal bleeding, discharge   Physical Exam   Blood pressure 125/69, pulse 66, temperature 98.3 F (36.8 C), temperature source Oral, resp. rate 18, last menstrual period 12/05/2013, SpO2 100.00%, unknown if currently breastfeeding.  Physical Exam  Constitutional: She is oriented to person, place, and time. She appears well-developed and well-nourished. No distress.  HENT:  Head: Normocephalic and atraumatic.  Cardiovascular: Normal rate.   Respiratory: Effort normal.  GI: Soft. Bowel sounds are normal. She exhibits no distension and no mass. There is no tenderness. There is no rebound and no guarding.  Genitourinary: Uterus is enlarged. Uterus is not tender. Cervix exhibits discharge (scant mucus discharge). Cervix exhibits no motion tenderness. Right adnexum displays no mass and no tenderness. Left adnexum displays no mass and no tenderness. There is bleeding (scant blood) around the vagina. Vaginal discharge (small amount of mucus discharge noted) found.  Neurological: She is alert and oriented to person, place, and time.  Skin: Skin is warm and dry. No erythema.  Psychiatric: She has a normal mood and affect.  Cervix: closed, thick  Results for orders placed during the hospital encounter of 02/22/14 (from the past 24 hour(s))  URINALYSIS, ROUTINE W REFLEX MICROSCOPIC     Status: None  Collection Time    02/22/14 10:00 PM      Result Value Ref Range   Color, Urine YELLOW  YELLOW   APPearance CLEAR  CLEAR   Specific Gravity, Urine 1.020  1.005 - 1.030   pH 6.0  5.0 - 8.0   Glucose, UA NEGATIVE  NEGATIVE mg/dL   Hgb urine dipstick NEGATIVE  NEGATIVE   Bilirubin Urine NEGATIVE  NEGATIVE   Ketones, ur NEGATIVE  NEGATIVE mg/dL   Protein, ur NEGATIVE  NEGATIVE mg/dL   Urobilinogen, UA 0.2  0.0 - 1.0 mg/dL   Nitrite NEGATIVE  NEGATIVE   Leukocytes, UA NEGATIVE  NEGATIVE    MAU Course  Procedures None  MDM +FHT - 161 bpm with  doppler No bleeding on exam UA today for dehydration - no signs of dehydration Recommended [place] and B6 for nausea  Assessment and Plan  A: Nausea and vomiting in pregnancy prior to [redacted] weeks gestation Vaginal bleeding in pregnancy prior to [redacted] weeks gestation  P: Discharge home Bleeding precautions discussed Advised Unisom and B6 PRN for N/V Advised increased PO hydration and advance diet as tolerated Patient advised to start prenatal care with Dr. Clearance CootsHarper as scheduled Patient may return to MAU as needed or if her condition were to change or worsen  Tonya StarrJulie N Ethier, PA-C  02/23/2014, 12:31 AM

## 2014-02-22 NOTE — MAU Note (Signed)
Lower abd cramps and vag bleeding like a period, states it just "scared me" .  Sometimes having blood tinge mucus. Was here 2 weeks ago and passed a large blood clot.  [facility]

## 2014-02-22 NOTE — Discharge Instructions (Signed)
Morning Sickness Morning sickness is when you feel sick to your stomach (nauseous) during pregnancy. This nauseous feeling may or may not come with vomiting. It often occurs in the morning but can be a problem any time of day. Morning sickness is most common during the first trimester, but it may continue throughout pregnancy. While morning sickness is unpleasant, it is usually harmless unless you develop severe and continual vomiting (hyperemesis gravidarum). This condition requires more intense treatment.  CAUSES  The cause of morning sickness is not completely known but seems to be related to normal hormonal changes that occur in pregnancy. RISK FACTORS You are at greater risk if you:  Experienced nausea or vomiting before your pregnancy.  Had morning sickness during a previous pregnancy.  Are pregnant with more than one baby, such as twins. TREATMENT  Do not use any medicines (prescription, over-the-counter, or herbal) for morning sickness without first talking to your health care provider. Your health care provider may prescribe or recommend:  Vitamin B6 supplements.  Anti-nausea medicines.  The herbal medicine ginger. HOME CARE INSTRUCTIONS   Only take over-the-counter or prescription medicines as directed by your health care provider.  Taking multivitamins before getting pregnant can prevent or decrease the severity of morning sickness in most women.   Eat a piece of dry toast or unsalted crackers before getting out of bed in the morning.   Eat five or six small meals a day.   Eat dry and bland foods (rice, baked potato). Foods high in carbohydrates are often helpful.  Do not drink liquids with your meals. Drink liquids between meals.   Avoid greasy, fatty, and spicy foods.   Get someone to cook for you if the smell of any food causes nausea and vomiting.   If you feel nauseous after taking prenatal vitamins, take the vitamins at night or with a snack.  Snack  on protein foods (nuts, yogurt, cheese) between meals if you are hungry.   Eat unsweetened gelatins for desserts.   Wearing an acupressure wristband (worn for sea sickness) may be helpful.   Acupuncture may be helpful.   Do not smoke.   Get a humidifier to keep the air in your house free of odors.   Get plenty of fresh air. SEEK MEDICAL CARE IF:   Your home remedies are not working, and you need medicine.  You feel dizzy or lightheaded.  You are losing weight. SEEK IMMEDIATE MEDICAL CARE IF:   You have persistent and uncontrolled nausea and vomiting.  You pass out (faint). Document Released: 01/09/2007 Document Revised: 07/21/2013 Document Reviewed: 05/05/2013 [facility] Patient Information 2014 [place].  Hyperemesis Gravidarum Diet Hyperemesis gravidarum is a severe form of morning sickness. It is characterized by frequent and severe vomiting. It happens during the first trimester of pregnancy. It may be caused by the rapid hormone changes that happen during pregnancy. It is associated with a 5% weight loss of pre-pregnancy weight. The hyperemesis diet may be used to lessen symptoms of nausea and vomiting. EATING GUIDELINES  Eat 5 to 6 small meals daily instead of 3 large meals.  Avoid foods with strong smells.  Avoid drinking 30 minutes before and after meals.  Avoid fried or high-fat foods, such as butter and cream sauces.  Starchy foods are usually well-tolerated, such as cereal, toast, bread, potatoes, pasta, rice, and pretzels.  Eat crackers before you get out of bed in the morning.  Avoid spicy foods.  Ginger may help with nausea. Add  tsp  ginger to hot tea or choose ginger tea.  Continue to take your prenatal vitamins as directed by your caregiver. SAMPLE MEAL PLAN Breakfast    cup oatmeal  1 slice toast  1 tsp heart-healthy margarine  1 tsp jelly  1 scrambled egg Midmorning Snack   1 cup low-fat yogurt Lunch   Plain ham  sandwich  Carrot or celery sticks  1 small apple  3 graham crackers Midafternoon Snack   Cheese and crackers Dinner  4 oz pork tenderloin  1 small baked potato  1 tsp margarine   cup broccoli   cup grapes Evening Snack  1 cup pudding Document Released: 09/15/2007 Document Revised: 02/10/2012 Document Reviewed: 04/20/2013 [employer] Patient Information 2014 [place], [place]. Subchorionic Hematoma A subchorionic hematoma is a gathering of blood between the outer wall of the placenta and the inner wall of the womb (uterus). The placenta is the organ that connects the fetus to the wall of the uterus. The placenta performs the feeding, breathing (oxygen to the fetus), and waste removal (excretory work) of the fetus.  Subchorionic hematoma is the most common abnormality found on a result from ultrasonography done during the first trimester or early second trimester of pregnancy. If there has been little or no vaginal bleeding, early small hematomas usually shrink on their own and do not affect your baby or pregnancy. The blood is gradually absorbed over 1 2 weeks. When bleeding starts later in pregnancy or the hematoma is larger or occurs in an older pregnant woman, the outcome may not be as good. Larger hematomas may get bigger, which increases the chances for miscarriage. Subchorionic hematoma also increases the risk of premature detachment of the placenta from the uterus, preterm (premature) labor, and stillbirth. HOME CARE INSTRUCTIONS   Stay on bed rest if your health care provider recommends this. Although bed rest will not prevent more bleeding or prevent a miscarriage, your health care provider may recommend bed rest until you are advised otherwise.  Avoid heavy lifting (more than 10 lb [4.5 kg]), exercise, sexual intercourse, or douching as directed by your health care provider.  Keep track of the number of pads you use each day and how soaked (saturated) they are. Write down  this information.  Do not use tampons.  Keep all follow-up appointments as directed by your health care provider. Your health care provider may ask you to have follow-up blood tests or ultrasound tests or both. SEEK IMMEDIATE MEDICAL CARE IF:   You have severe cramps in your stomach, back, abdomen, or pelvis.  You have a fever.  You pass large clots or tissue. Save any tissue for your health care provider to look at.  Your bleeding increases or you become lightheaded, feel weak, or have fainting episodes. Document Released: 03/05/2007 Document Revised: 09/08/2013 Document Reviewed: 06/17/2013 [facility] Patient Information 2014 [place], [place].  For the nausea you can take an over the counter medication known as UNISOM in combination with VITAMIN B6 50mg  twice a day. This is the same as a prescription medication known as Diclegis, and is the only known safe medication to take in early pregnancy. There are other medications available if this does now work. However, those medications are not known to be as safe.

## 2014-03-01 ENCOUNTER — Ambulatory Visit (INDEPENDENT_AMBULATORY_CARE_PROVIDER_SITE_OTHER): Payer: Medicaid Other | Admitting: Obstetrics

## 2014-03-01 DIAGNOSIS — Z3201 Encounter for pregnancy test, result positive: Secondary | ICD-10-CM

## 2014-03-06 ENCOUNTER — Encounter: Payer: Self-pay | Admitting: Obstetrics

## 2014-03-15 ENCOUNTER — Encounter (HOSPITAL_COMMUNITY): Payer: Self-pay | Admitting: Emergency Medicine

## 2014-03-15 DIAGNOSIS — Z79899 Other long term (current) drug therapy: Secondary | ICD-10-CM | POA: Insufficient documentation

## 2014-03-15 DIAGNOSIS — O9989 Other specified diseases and conditions complicating pregnancy, childbirth and the puerperium: Secondary | ICD-10-CM | POA: Insufficient documentation

## 2014-03-15 DIAGNOSIS — H53149 Visual discomfort, unspecified: Secondary | ICD-10-CM | POA: Insufficient documentation

## 2014-03-15 DIAGNOSIS — R51 Headache: Secondary | ICD-10-CM | POA: Insufficient documentation

## 2014-03-15 DIAGNOSIS — R112 Nausea with vomiting, unspecified: Secondary | ICD-10-CM | POA: Insufficient documentation

## 2014-03-15 NOTE — ED Notes (Signed)
Pt. reports migraine headache with emesis ( x3) onset this evening , pt. stated she is [redacted] weeks pregnant ( G5P4) . No blurred vision .

## 2014-03-16 ENCOUNTER — Emergency Department (HOSPITAL_COMMUNITY)
Admission: EM | Admit: 2014-03-16 | Discharge: 2014-03-16 | Disposition: A | Discharge status: Home / Self Care | Payer: Medicaid Other | Attending: Emergency Medicine | Admitting: Emergency Medicine

## 2014-03-16 DIAGNOSIS — R519 Headache, unspecified: Secondary | ICD-10-CM

## 2014-03-16 DIAGNOSIS — R51 Headache: Secondary | ICD-10-CM

## 2014-03-16 MED ORDER — SODIUM CHLORIDE 0.9 % IV BOLUS (SEPSIS)
1000.0000 mL | Freq: Once | INTRAVENOUS | Status: AC
  Administered 2014-03-16: 1000 mL via INTRAVENOUS

## 2014-03-16 MED ORDER — METOCLOPRAMIDE HCL 5 MG/ML IJ SOLN
10.0000 mg | Freq: Once | INTRAMUSCULAR | Status: AC
  Administered 2014-03-16: 10 mg via INTRAVENOUS
  Filled 2014-03-16: qty 2

## 2014-03-16 MED ORDER — DIPHENHYDRAMINE HCL 50 MG/ML IJ SOLN
25.0000 mg | Freq: Once | INTRAMUSCULAR | Status: AC
Start: 2014-03-16 — End: 2014-03-16
  Administered 2014-03-16: 25 mg via INTRAVENOUS
  Filled 2014-03-16: qty 1

## 2014-03-16 NOTE — ED Notes (Signed)
MD Miller at bedside. 

## 2014-03-16 NOTE — ED Provider Notes (Signed)
CSN: [id number]     Arrival date & time 03/15/14  2220 History   First MD Initiated Contact with Patient 03/16/14 0154     Chief Complaint  Patient presents with  . Migraine     (Consider location/radiation/quality/duration/timing/severity/associated sxs/prior Treatment) HPI Comments: 33 year old female, no significant past medical history who is [redacted] weeks pregnant [facility] and presents with a complaint of a headache. The headache is right-sided, associated with photophobia, throbbing in nature with occasional shooting sharp pains. There was several episodes of nausea and vomiting prior to arrival but this has resolved. She denies any weakness, numbness, visual changes, swelling of the legs, dysuria, fevers, chills, sinus pressure or drainage. This headache has been ongoing for the last day and a half but waxes and wanes in intensity. She has had no medications prior to arrival  Patient is a 33 y.o. female presenting with migraines. The history is provided by the patient.  Migraine    Past Medical History  Diagnosis Date  . No pertinent past medical history   . Medical history non-contributory    Past Surgical History  Procedure Laterality Date  . Wisdom tooth extraction    . No past surgeries     Family History  Problem Relation Age of Onset  . Anesthesia problems Neg Hx   . Sickle cell trait Daughter   . Other Daughter     shortened ulna  . Sickle cell trait Son   . Sickle cell trait Daughter   . Hypertension Mother   . Diabetes Mother    History  Substance Use Topics  . Smoking status: Never Smoker   . Smokeless tobacco: Never Used  . Alcohol Use: No   OB History   Grav Para Term Preterm Abortions TAB SAB Ect Mult Living   5 4 4  0 0 0 0 0 0 4     Review of Systems  All other systems reviewed and are negative.     Allergies  Review of patient's allergies indicates no known allergies.  Home Medications   Prior to Admission  medications   Medication Sig Start Date End Date Taking? Authorizing Provider  Prenatal Vit-Fe Fumarate-FA (PRENATAL MULTIVITAMIN) TABS tablet Take 1 tablet by mouth daily at 12 noon.   Yes Historical Provider, MD   BP 111/54  Pulse 76  Temp(Src) 98.2 F (36.8 C) (Oral)  Resp 18  Ht 5\' 3"  (1.6 m)  Wt 134 lb 3 oz (60.867 kg)  BMI 23.78 kg/m2  SpO2 99%  LMP 12/05/2013 Physical Exam  Nursing note and vitals reviewed. Constitutional: She appears well-developed and well-nourished. No distress.  HENT:  Head: Normocephalic and atraumatic.  Mouth/Throat: Oropharynx is clear and moist. No oropharyngeal exudate.  No sinus drainage or tenderness  Eyes: Conjunctivae and EOM are normal. Pupils are equal, round, and reactive to light. Right eye exhibits no discharge. Left eye exhibits no discharge. No scleral icterus.  Neck: Normal range of motion. Neck supple. No JVD present. No thyromegaly present.  Cardiovascular: Normal rate, regular rhythm, normal heart sounds and intact distal pulses.  Exam reveals no gallop and no friction rub.   No murmur heard. Pulmonary/Chest: Effort normal and breath sounds normal. No respiratory distress. She has no wheezes. She has no rales.  Abdominal: Soft. Bowel sounds are normal. She exhibits no distension and no mass. There is no tenderness.  Uterus felt in the pelvis anteriorly midline, no ttp  Musculoskeletal: Normal range of motion. She exhibits no  edema and no tenderness.  Lymphadenopathy:    She has no cervical adenopathy.  Neurological: She is alert. Coordination normal.  Neurologic exam:  Speech clear, pupils equal round reactive to light, extraocular movements intact  Normal peripheral visual fields Cranial nerves III through XII normal including no facial droop Follows commands, moves all extremities x4, normal strength to bilateral upper and lower extremities at all major muscle groups including grip Sensation normal to light touch and  pinprick Coordination intact, no limb ataxia, finger-nose-finger normal Rapid alternating movements normal No pronator drift Gait normal   Skin: Skin is warm and dry. No rash noted. No erythema.  Psychiatric: She has a normal mood and affect. Her behavior is normal.    ED Course  Procedures (including critical care time) Labs Review Labs Reviewed - No data to display  Imaging Review No results found.    MDM   Final diagnoses:  Headache    Well appearing, no focal neuro sx, vital signs are unremarkable, patient likely has a primary headache, benign, no signs of preeclampsia, less than 20 weeks, avoid anti-inflammatories, fluids, Reglan, Benadryl, recheck. Patient is in agreement with the plan  Meds given in ED:  Medications  metoCLOPramide (REGLAN) injection 10 mg (10 mg Intravenous Given 03/16/14 0258)  diphenhydrAMINE (BENADRYL) injection 25 mg (25 mg Intravenous Given 03/16/14 0300)  sodium chloride 0.9 % bolus 1,000 mL (0 mLs Intravenous Stopped 03/16/14 0340)       Tonya RollerBrian D Philbert Ocallaghan, MD 03/16/14 [phone]

## 2014-03-17 ENCOUNTER — Ambulatory Visit (INDEPENDENT_AMBULATORY_CARE_PROVIDER_SITE_OTHER): Payer: Medicaid Other | Admitting: Obstetrics

## 2014-03-17 ENCOUNTER — Encounter: Payer: Self-pay | Admitting: Obstetrics

## 2014-03-17 VITALS — BP 114/67 | Temp 98.4°F | Wt 132.0 lb

## 2014-03-17 DIAGNOSIS — L940 Localized scleroderma [morphea]: Secondary | ICD-10-CM

## 2014-03-17 DIAGNOSIS — Z3480 Encounter for supervision of other normal pregnancy, unspecified trimester: Secondary | ICD-10-CM

## 2014-03-17 DIAGNOSIS — L900 Lichen sclerosus et atrophicus: Secondary | ICD-10-CM

## 2014-03-17 LAB — POCT URINALYSIS DIPSTICK
Bilirubin, UA: NEGATIVE
Blood, UA: NEGATIVE
GLUCOSE UA: NEGATIVE
Ketones, UA: NEGATIVE
Leukocytes, UA: NEGATIVE
Nitrite, UA: NEGATIVE
Protein, UA: NEGATIVE
Spec Grav, UA: 1.02
Urobilinogen, UA: NEGATIVE
pH, UA: 5

## 2014-03-17 MED ORDER — CLOBETASOL PROPIONATE 0.05 % EX CREA: 1.0000 "application " | TOPICAL_CREAM | Freq: Two times a day (BID) | CUTANEOUS | Status: DC

## 2014-03-17 NOTE — Progress Notes (Signed)
Pulse- 85  Subjective:    Tonya Gray is being seen today for her first obstetrical visit.  This is a planned pregnancy. She is at 5872w4d gestation. Her obstetrical history is significant for none. Relationship with FOB: spouse, living together. Patient does intend to breast feed. Pregnancy history fully reviewed.  The information documented in the HPI was reviewed and verified.  Menstrual History: OB History   Grav Para Term Preterm Abortions TAB SAB Ect Mult Living   5 4 4  0 0 0 0 0 0 4      Last Pap: 2013 WNL Menarche age: [age]  Patient's last menstrual period was 12/05/2013.    Past Medical History  Diagnosis Date  . No pertinent past medical history   . Medical history non-contributory     Past Surgical History  Procedure Laterality Date  . Wisdom tooth extraction    . No past surgeries       (Not in a hospital admission) No Known Allergies  History  Substance Use Topics  . Smoking status: Never Smoker   . Smokeless tobacco: Never Used  . Alcohol Use: No    Family History  Problem Relation Age of Onset  . Anesthesia problems Neg Hx   . Sickle cell trait Daughter   . Other Daughter     shortened ulna  . Sickle cell trait Son   . Sickle cell trait Daughter   . Hypertension Mother   . Diabetes Mother      Review of Systems Constitutional: negative for weight loss Gastrointestinal: negative for vomiting Genitourinary:  Recurrence of lichen sclerosus Musculoskeletal:negative for back pain Behavioral/Psych: negative for abusive relationship, depression, illegal drug usage and tobacco use    Objective:     General:   alert  Skin:   no rash or abnormalities  Lungs:   clear to auscultation bilaterally  Heart:   regular rate and rhythm, S1, S2 normal, no murmur, click, rub or gallop  Breasts:   normal without suspicious masses, skin or nipple changes or axillary nodes  Abdomen:  normal findings: no organomegaly, soft, non-tender and no hernia  Pelvis:   External genitalia: pale inflammatory changes from lower perineum to perianal area Urinary system: urethral meatus normal and bladder without fullness, nontender Vaginal: normal without tenderness, induration or masses Cervix: normal appearance Adnexa: normal bimanual  Uterus: anteverted and non-tender, 14 weeks size      Lab Review Urine pregnancy test Labs reviewed yes Radiologic studies reviewed yes Assessment:    Pregnancy at 6872w4d weeks   Lichen Sclerosus   Plan:     No orders of the defined types were placed in this encounter.   Prenatal vitamins.  Clobetasol 0.05% cream Rx  Counseling provided regarding continued use of seat belts, cessation of alcohol consumption, smoking or use of illicit drugs; infection precautions i.e., influenza/TDAP immunizations, toxoplasmosis,CMV, parvovirus, listeria and varicella; workplace safety, exercise during pregnancy; routine dental care, safe medications, sexual activity, hot tubs, saunas, pools, travel, caffeine use, fish and methlymercury, potential toxins, hair treatments, varicose veins Weight gain recommendations per IOM guidelines reviewed: underweight/BMI< 18.5--> gain 28 - 40 lbs; normal weight/BMI 18.5 - 24.9--> gain 25 - 35 lbs; overweight/BMI 25 - 29.9--> gain 15 - 25 lbs; obese/BMI >30->gain  11 - 20 lbs Problem list reviewed and updated. FIRST/CF mutation testing/NIPT/QUAD SCREEN/fragile X/[name] population testing/Spinal muscular atrophy discussed: requested. Role of ultrasound in pregnancy discussed; fetal survey: requested. Amniocentesis discussed: not indicated. VBAC calculator score: VBAC consent form provided  Follow up in 2 weeks.

## 2014-03-17 NOTE — Addendum Note (Signed)
Addended by: Odessa FlemingBOHNE, Darriel Utter M on: 03/17/2014 12:55 PM   Modules accepted: Orders

## 2014-03-18 LAB — AFP, QUAD SCREEN
AFP: 38.5 IU/mL
Curr Gest Age: 14.4 wks.days
Down Syndrome Scr Risk Est: 1:6180 {titer}
HCG, Total: 71993 m[IU]/mL
INH: 231.8 pg/mL
Interpretation-AFP: NEGATIVE
MoM for AFP: 1.25
MoM for INH: 1.04
MoM for hCG: 1.87
OPEN SPINA BIFIDA: NEGATIVE
Tri 18 Scr Risk Est: NEGATIVE
Trisomy 18 (Edward) Syndrome Interp.: 1:47800 {titer}
uE3 Mom: 1.11
uE3 Value: 0.3 ng/mL

## 2014-03-18 LAB — PAP IG W/ RFLX HPV ASCU

## 2014-03-18 LAB — OBSTETRIC PANEL
Antibody Screen: NEGATIVE
Basophils Absolute: 0 10*3/uL (ref 0.0–0.1)
Basophils Relative: 0 % (ref 0–1)
EOS ABS: 0.1 10*3/uL (ref 0.0–0.7)
Eosinophils Relative: 1 % (ref 0–5)
HCT: 36.1 % (ref 36.0–46.0)
HEP B S AG: NEGATIVE
Hemoglobin: 12.3 g/dL (ref 12.0–15.0)
LYMPHS ABS: 1.9 10*3/uL (ref 0.7–4.0)
Lymphocytes Relative: 18 % (ref 12–46)
MCH: 29.7 pg (ref 26.0–34.0)
MCHC: 34.1 g/dL (ref 30.0–36.0)
MCV: 87.2 fL (ref 78.0–100.0)
Monocytes Absolute: 0.6 10*3/uL (ref 0.1–1.0)
Monocytes Relative: 6 % (ref 3–12)
NEUTROS ABS: 7.9 10*3/uL — AB (ref 1.7–7.7)
Neutrophils Relative %: 75 % (ref 43–77)
PLATELETS: 280 10*3/uL (ref 150–400)
RBC: 4.14 MIL/uL (ref 3.87–5.11)
RDW: 14.9 % (ref 11.5–15.5)
Rh Type: POSITIVE
Rubella: 2.71 Index — ABNORMAL HIGH (ref <0.90)
WBC: 10.5 10*3/uL (ref 4.0–10.5)

## 2014-03-18 LAB — HIV ANTIBODY (ROUTINE TESTING W REFLEX): HIV 1&2 Ab, 4th Generation: NONREACTIVE

## 2014-03-18 LAB — VARICELLA ZOSTER ANTIBODY, IGG: Varicella IgG: 798.6 Index — ABNORMAL HIGH (ref <135.00)

## 2014-03-18 LAB — GC/CHLAMYDIA PROBE AMP
CT Probe RNA: NEGATIVE
GC Probe RNA: NEGATIVE

## 2014-03-18 LAB — VITAMIN D 25 HYDROXY (VIT D DEFICIENCY, FRACTURES): Vit D, 25-Hydroxy: 16 ng/mL — ABNORMAL LOW (ref 30–89)

## 2014-03-18 LAB — WET PREP BY MOLECULAR PROBE
CANDIDA SPECIES: NEGATIVE
Gardnerella vaginalis: NEGATIVE
Trichomonas vaginosis: NEGATIVE

## 2014-03-18 LAB — CULTURE, OB URINE
Colony Count: NO GROWTH
Organism ID, Bacteria: NO GROWTH

## 2014-03-21 LAB — HEMOGLOBINOPATHY EVALUATION
HGB A: 57.8 % — AB (ref 96.8–97.8)
HGB S QUANTITAION: 38.4 % — AB
Hemoglobin Other: 0 %
Hgb A2 Quant: 3.8 % — ABNORMAL HIGH (ref 2.2–3.2)
Hgb F Quant: 0 % (ref 0.0–2.0)

## 2014-03-24 NOTE — Progress Notes (Signed)
Patient not seen by physician.  She rescheduled a NOB visit because of new diagnosis of pregnancy.

## 2014-03-31 ENCOUNTER — Encounter: Payer: Self-pay | Admitting: Obstetrics

## 2014-03-31 ENCOUNTER — Ambulatory Visit (INDEPENDENT_AMBULATORY_CARE_PROVIDER_SITE_OTHER): Payer: Medicaid Other | Admitting: Obstetrics

## 2014-03-31 VITALS — BP 120/85 | HR 78 | Temp 98.1°F | Wt 140.0 lb

## 2014-03-31 DIAGNOSIS — Z3480 Encounter for supervision of other normal pregnancy, unspecified trimester: Secondary | ICD-10-CM

## 2014-03-31 DIAGNOSIS — Z1389 Encounter for screening for other disorder: Secondary | ICD-10-CM

## 2014-03-31 LAB — POCT URINALYSIS DIPSTICK
BILIRUBIN UA: NEGATIVE
Blood, UA: NEGATIVE
Glucose, UA: NEGATIVE
Ketones, UA: NEGATIVE
LEUKOCYTES UA: NEGATIVE
NITRITE UA: NEGATIVE
PH UA: 5
Protein, UA: NEGATIVE
Spec Grav, UA: 1.01
Urobilinogen, UA: NEGATIVE

## 2014-03-31 NOTE — Progress Notes (Signed)
  Subjective:    Tonya Gray is a 33 y.o. female being seen today for her obstetrical visit. She is at 6081w4d gestation. Patient reports: no complaints.  Problem List Items Addressed This Visit   None    Visit Diagnoses   Supervision of other normal pregnancy    -  Primary    Relevant Orders       POCT urinalysis dipstick (Completed)    Encounter for routine screening for malformation using ultrasonics        Relevant Orders       US OB Comp + 14 Wk      Patient Active Problem List   Diagnosis Date Noted  . Lichen sclerosus 03/17/2014  . Sickle cell trait 05/26/2012    Objective:     BP 120/85  Pulse 78  Temp(Src) 98.1 F (36.7 C)  Wt 140 lb (63.504 kg)  LMP 12/05/2013 Uterine Size: Below umbilicus     Assessment:    Pregnancy @ 281w4d  weeks Doing well    Plan:    Problem list reviewed and updated. Labs reviewed.  Follow up in 4 weeks. FIRST/CF mutation testing/NIPT/QUAD SCREEN/fragile X/[name] population testing/Spinal muscular atrophy discussed: requested. Role of ultrasound in pregnancy discussed; fetal survey: requested. Amniocentesis discussed: not indicated.

## 2014-04-20 ENCOUNTER — Ambulatory Visit (INDEPENDENT_AMBULATORY_CARE_PROVIDER_SITE_OTHER): Payer: Medicaid Other

## 2014-04-20 DIAGNOSIS — Z1389 Encounter for screening for other disorder: Secondary | ICD-10-CM

## 2014-04-27 ENCOUNTER — Ambulatory Visit (INDEPENDENT_AMBULATORY_CARE_PROVIDER_SITE_OTHER): Payer: Medicaid Other | Admitting: Obstetrics

## 2014-04-27 ENCOUNTER — Encounter: Payer: Self-pay | Admitting: Obstetrics

## 2014-04-27 VITALS — BP 118/72 | HR 79 | Temp 98.3°F | Wt 146.0 lb

## 2014-04-27 DIAGNOSIS — Z3480 Encounter for supervision of other normal pregnancy, unspecified trimester: Secondary | ICD-10-CM

## 2014-04-27 DIAGNOSIS — B369 Superficial mycosis, unspecified: Secondary | ICD-10-CM

## 2014-04-27 LAB — POCT URINALYSIS DIPSTICK
BILIRUBIN UA: NEGATIVE
Blood, UA: NEGATIVE
GLUCOSE UA: NEGATIVE
KETONES UA: NEGATIVE
LEUKOCYTES UA: NEGATIVE
Nitrite, UA: NEGATIVE
Protein, UA: NEGATIVE
Spec Grav, UA: 1.015
Urobilinogen, UA: NEGATIVE
pH, UA: 6

## 2014-04-27 MED ORDER — NYSTATIN 100000 UNIT/GM EX CREA: 1.0000 "application " | TOPICAL_CREAM | Freq: Two times a day (BID) | CUTANEOUS | Status: DC

## 2014-04-27 NOTE — Progress Notes (Signed)
Subjective:    Tonya Gray is a 33 y.o. female being seen today for her obstetrical visit. She is at [redacted]w[redacted]d gestation. Patient reports: fungal rash under breasts . Fetal movement: normal.  Problem List Items Addressed This Visit   Fungal dermatitis   Relevant Medications      nystatin cream (MYCOSTATIN)    Other Visit Diagnoses   Supervision of other normal pregnancy    -  Primary    Relevant Orders       POCT urinalysis dipstick (Completed)      Patient Active Problem List   Diagnosis Date Noted  . 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 Lichen sclerosus 03/17/2014  . Sickle cell trait 05/26/2012   Objective:    BP 118/72  Pulse 79  Temp(Src) 98.3 F (36.8 C)  Wt 146 lb (66.225 kg)  LMP 12/05/2013 FHT: 150 BPM  Uterine Size: size equals dates     Assessment:    Pregnancy @ [redacted]w[redacted]d    Fungal dermatitis of breasts.  Plan:    OBGCT: discussed. Nystatin cream Rx  Labs, problem list reviewed and updated 2 hr GTT planned Follow up in 2 weeks.

## 2014-04-28 LAB — US OB COMP + 14 WK

## 2014-05-25 ENCOUNTER — Encounter: Payer: Medicaid Other | Admitting: Obstetrics

## 2014-05-25 ENCOUNTER — Other Ambulatory Visit: Payer: Medicaid Other

## 2014-05-27 ENCOUNTER — Encounter: Payer: Self-pay | Admitting: Obstetrics & Gynecology

## 2014-05-27 ENCOUNTER — Ambulatory Visit (INDEPENDENT_AMBULATORY_CARE_PROVIDER_SITE_OTHER): Payer: Medicaid Other | Admitting: Obstetrics & Gynecology

## 2014-05-27 ENCOUNTER — Other Ambulatory Visit: Payer: Medicaid Other

## 2014-05-27 VITALS — BP 129/80 | HR 94 | Temp 98.0°F | Wt 158.0 lb

## 2014-05-27 DIAGNOSIS — D573 Sickle-cell trait: Secondary | ICD-10-CM

## 2014-05-27 DIAGNOSIS — Z3480 Encounter for supervision of other normal pregnancy, unspecified trimester: Secondary | ICD-10-CM

## 2014-05-27 DIAGNOSIS — Z3482 Encounter for supervision of other normal pregnancy, second trimester: Secondary | ICD-10-CM

## 2014-05-27 LAB — POCT URINALYSIS DIPSTICK
BILIRUBIN UA: NEGATIVE
GLUCOSE UA: NEGATIVE
Ketones, UA: NEGATIVE
LEUKOCYTES UA: NEGATIVE
NITRITE UA: NEGATIVE
Protein, UA: NEGATIVE
RBC UA: NEGATIVE
Spec Grav, UA: 1.01
Urobilinogen, UA: NEGATIVE
pH, UA: 6

## 2014-05-27 NOTE — Progress Notes (Signed)
Subjective:    Tonya Gray is a 33 y.o. female being seen today for her obstetrical visit. She is at 2629w5d gestation. Patient reports: fungal rash under breasts . Fetal movement: normal.  Problem List Items Addressed This Visit   Sickle cell trait   Supervision of other normal pregnancy - Primary   Relevant Orders      POCT urinalysis dipstick     Patient Active Problem List   Diagnosis Date Noted  . Supervision of other normal pregnancy 05/27/2014    Priority: High  . Sickle cell trait 05/26/2012    Priority: High  . 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 Lichen sclerosus 03/17/2014   Objective:    BP 129/80  Pulse 94  Temp(Src) 98 F (36.7 C)  Wt 71.668 kg (158 lb)  LMP 12/05/2013 FHT: 150 BPM  Uterine Size: size equals dates     Assessment:    Pregnancy @ 6729w5d     Plan:    Nystatin cream Rx Labs, problem list reviewed and updated Follow up in 4 weeks.

## 2014-05-27 NOTE — Patient Instructions (Signed)
Dental Work and Pregnancy Proper dental care before, during, and after pregnancy is important for you and your baby. Pregnancy hormones can sometimes cause the gums to swell, which makes it easier for food to become trapped between teeth. The health of your teeth and gums can affect your growing baby.  DENTAL CARE RECOMMENDATIONS To help prevent infection and maintain healthy teeth and gums, a thorough oral examination is recommended for all women in the first trimester of pregnancy. Routine cleanings and examinations are recommended throughout pregnancy.  DENTAL CARE CONSIDERATIONS  Tell your dentist if you are pregnant or want to become pregnant.   If you are pregnant, routine X-ray exams should be avoided until after your baby is born. However, they do not need to be avoided if you are trying to become pregnant.   If you need an emergency procedure that includes a dental X-ray exam during pregnancy, very low levels of radiation will be emitted from the X-ray machines. Lead aprons can be used for protection of the chest, abdomen, and thyroid.  Your dentist will help you weigh the risks and benefits of dental procedures during pregnancy. If possible, it is best to have dental procedures (such as cavity fillings and crown repair) during the second trimester of pregnancy or after the baby is born.   If you and your dentist decide to postpone a procedure for any reason, your dentist can suggest treatment to reduce the chance of infection until the procedure is performed.  HOME CARE INSTRUCTIONS   Follow good oral hygiene habits at home.  Brush your teeth twice daily with fluoride toothpaste. Brush thoroughly for at least 2 minutes. Avoid strong flavored toothpastes if you have morning sickness.   Floss at least once daily.   Eat a well-balanced diet low in sugar and carbohydrates.   See your dentist for normal interval oral examinations and cleanings.   If you vomit, rinse your  mouth with water afterward.   Make a dental appointment if you experience oral problems.   Follow up with your dentist as directed.  SEEK DENTAL CARE IF:  Oral symptoms such as pain, bleeding, swelling, or inflammation develop or worsen.   You develop growths or swelling in between teeth.   You develop signs of infection, such as:   A fever of more than 100.5 F (38.1 C).   Chills. Document Released: 05/08/2010 Document Revised: 09/08/2013 Document Reviewed: 04/21/2013 [facility] Patient Information 2015 [employer] [place]. This information is not intended to replace advice given to you by your health care provider. Make sure you discuss any questions you have with your health care provider.

## 2014-05-27 NOTE — Addendum Note (Signed)
Addended by: Odessa FlemingBOHNE, KRISTINA M on: 05/27/2014 03:36 PM   Modules accepted: Orders

## 2014-05-28 LAB — CBC
HCT: 31.6 % — ABNORMAL LOW (ref 36.0–46.0)
Hemoglobin: 10.7 g/dL — ABNORMAL LOW (ref 12.0–15.0)
MCH: 28.4 pg (ref 26.0–34.0)
MCHC: 33.9 g/dL (ref 30.0–36.0)
MCV: 83.8 fL (ref 78.0–100.0)
PLATELETS: 271 10*3/uL (ref 150–400)
RBC: 3.77 MIL/uL — ABNORMAL LOW (ref 3.87–5.11)
RDW: 14 % (ref 11.5–15.5)
WBC: 13.4 10*3/uL — ABNORMAL HIGH (ref 4.0–10.5)

## 2014-05-28 LAB — GLUCOSE TOLERANCE, 2 HOURS W/ 1HR
GLUCOSE, 2 HOUR: 95 mg/dL (ref 70–139)
GLUCOSE, FASTING: 76 mg/dL (ref 70–99)
GLUCOSE: 144 mg/dL (ref 70–170)

## 2014-05-28 LAB — HIV ANTIBODY (ROUTINE TESTING W REFLEX): HIV 1&2 Ab, 4th Generation: NONREACTIVE

## 2014-05-28 LAB — RPR

## 2014-06-27 ENCOUNTER — Inpatient Hospital Stay (HOSPITAL_COMMUNITY): Payer: Medicaid Other

## 2014-06-27 ENCOUNTER — Encounter (HOSPITAL_COMMUNITY): Payer: Self-pay | Admitting: *Deleted

## 2014-06-27 ENCOUNTER — Encounter: Payer: Medicaid Other | Admitting: Obstetrics

## 2014-06-27 ENCOUNTER — Inpatient Hospital Stay (HOSPITAL_COMMUNITY)
Admission: AD | Admit: 2014-06-27 | Discharge: 2014-07-22 | DRG: 765 | Disposition: A | Discharge status: Home / Self Care | Payer: Medicaid Other | Source: Ambulatory Visit | Attending: Obstetrics | Admitting: Obstetrics

## 2014-06-27 DIAGNOSIS — Z8249 Family history of ischemic heart disease and other diseases of the circulatory system: Secondary | ICD-10-CM

## 2014-06-27 DIAGNOSIS — O4290 Premature rupture of membranes, unspecified as to length of time between rupture and onset of labor, unspecified weeks of gestation: Principal | ICD-10-CM | POA: Diagnosis present

## 2014-06-27 DIAGNOSIS — O328XX0 Maternal care for other malpresentation of fetus, not applicable or unspecified: Secondary | ICD-10-CM | POA: Diagnosis present

## 2014-06-27 DIAGNOSIS — D509 Iron deficiency anemia, unspecified: Secondary | ICD-10-CM | POA: Diagnosis present

## 2014-06-27 DIAGNOSIS — O360990 Maternal care for other rhesus isoimmunization, unspecified trimester, not applicable or unspecified: Secondary | ICD-10-CM | POA: Diagnosis present

## 2014-06-27 DIAGNOSIS — O42919 Preterm premature rupture of membranes, unspecified as to length of time between rupture and onset of labor, unspecified trimester: Secondary | ICD-10-CM | POA: Diagnosis present

## 2014-06-27 DIAGNOSIS — O9902 Anemia complicating childbirth: Secondary | ICD-10-CM | POA: Diagnosis present

## 2014-06-27 DIAGNOSIS — D573 Sickle-cell trait: Secondary | ICD-10-CM | POA: Diagnosis present

## 2014-06-27 DIAGNOSIS — Z833 Family history of diabetes mellitus: Secondary | ICD-10-CM

## 2014-06-27 DIAGNOSIS — Z98891 History of uterine scar from previous surgery: Secondary | ICD-10-CM

## 2014-06-27 LAB — TYPE AND SCREEN
ABO/RH(D): O POS
Antibody Screen: NEGATIVE

## 2014-06-27 LAB — COMPREHENSIVE METABOLIC PANEL WITH GFR
ALT: 7 U/L (ref 0–35)
AST: 26 U/L (ref 0–37)
Albumin: 2.7 g/dL — ABNORMAL LOW (ref 3.5–5.2)
Alkaline Phosphatase: 114 U/L (ref 39–117)
Anion gap: 13 (ref 5–15)
BUN: 5 mg/dL — ABNORMAL LOW (ref 6–23)
CO2: 21 meq/L (ref 19–32)
Calcium: 9.1 mg/dL (ref 8.4–10.5)
Chloride: 104 meq/L (ref 96–112)
Creatinine, Ser: 0.57 mg/dL (ref 0.50–1.10)
GFR calc Af Amer: >90 mL/min
GFR calc non Af Amer: >90 mL/min
Glucose, Bld: 99 mg/dL (ref 70–99)
Potassium: 4.7 meq/L (ref 3.7–5.3)
Sodium: 138 meq/L (ref 137–147)
Total Bilirubin: 0.2 mg/dL — ABNORMAL LOW (ref 0.3–1.2)
Total Protein: 6.7 g/dL (ref 6.0–8.3)

## 2014-06-27 LAB — CBC
HCT: 31.1 % — ABNORMAL LOW (ref 36.0–46.0)
Hemoglobin: 10.6 g/dL — ABNORMAL LOW (ref 12.0–15.0)
MCH: 27.5 pg (ref 26.0–34.0)
MCHC: 34.1 g/dL (ref 30.0–36.0)
MCV: 80.6 fL (ref 78.0–100.0)
PLATELETS: 282 10*3/uL (ref 150–400)
RBC: 3.86 MIL/uL — ABNORMAL LOW (ref 3.87–5.11)
RDW: 14.2 % (ref 11.5–15.5)
WBC: 15.3 10*3/uL — ABNORMAL HIGH (ref 4.0–10.5)

## 2014-06-27 LAB — URINALYSIS, ROUTINE W REFLEX MICROSCOPIC
Bilirubin Urine: NEGATIVE
Glucose, UA: NEGATIVE mg/dL
Hgb urine dipstick: NEGATIVE
Ketones, ur: NEGATIVE mg/dL
Nitrite: NEGATIVE
Protein, ur: NEGATIVE mg/dL
Specific Gravity, Urine: 1.01 (ref 1.005–1.030)
Urobilinogen, UA: 0.2 mg/dL (ref 0.0–1.0)
pH: 7.5 (ref 5.0–8.0)

## 2014-06-27 LAB — SAMPLE TO BLOOD BANK

## 2014-06-27 LAB — URINE MICROSCOPIC-ADD ON

## 2014-06-27 MED ORDER — MAGNESIUM SULFATE 40 G IN LACTATED RINGERS - SIMPLE
2.0000 g/h | INTRAVENOUS | Status: DC
  Administered 2014-06-28: 2 g/h via INTRAVENOUS
  Filled 2014-06-27 (×2): qty 500

## 2014-06-27 MED ORDER — AZITHROMYCIN 250 MG PO TABS
500.0000 mg | ORAL_TABLET | Freq: Every day | ORAL | Status: DC
  Administered 2014-06-27 – 2014-07-03 (×7): 500 mg via ORAL
  Filled 2014-06-27 (×7): qty 2

## 2014-06-27 MED ORDER — BETAMETHASONE SOD PHOS & ACET 6 (3-3) MG/ML IJ SUSP
12.0000 mg | INTRAMUSCULAR | Status: AC
  Administered 2014-06-27 – 2014-06-28 (×2): 12 mg via INTRAMUSCULAR
  Filled 2014-06-27 (×3): qty 2

## 2014-06-27 MED ORDER — LACTATED RINGERS IV SOLN
INTRAVENOUS | Status: DC
  Administered 2014-06-27 – 2014-07-04 (×15): via INTRAVENOUS

## 2014-06-27 MED ORDER — CALCIUM CARBONATE ANTACID 500 MG PO CHEW
2.0000 | CHEWABLE_TABLET | ORAL | Status: DC | PRN
  Administered 2014-07-18: 400 mg via ORAL
  Filled 2014-06-27: qty 1
  Filled 2014-06-27: qty 2

## 2014-06-27 MED ORDER — DOCUSATE SODIUM 100 MG PO CAPS
100.0000 mg | ORAL_CAPSULE | Freq: Every day | ORAL | Status: DC
  Administered 2014-06-29 – 2014-07-09 (×10): 100 mg via ORAL
  Filled 2014-06-27 (×12): qty 1

## 2014-06-27 MED ORDER — PRENATAL MULTIVITAMIN CH
1.0000 | ORAL_TABLET | Freq: Every day | ORAL | Status: DC
  Administered 2014-06-30 – 2014-07-18 (×19): 1 via ORAL
  Filled 2014-06-27 (×3): qty 1

## 2014-06-27 MED ORDER — ZOLPIDEM TARTRATE 5 MG PO TABS
5.0000 mg | ORAL_TABLET | Freq: Every evening | ORAL | Status: DC | PRN
  Administered 2014-06-27 – 2014-07-17 (×6): 5 mg via ORAL
  Filled 2014-06-27 (×6): qty 1

## 2014-06-27 MED ORDER — MAGNESIUM SULFATE BOLUS VIA INFUSION
4.0000 g | Freq: Once | INTRAVENOUS | Status: AC
  Administered 2014-06-27: 4 g via INTRAVENOUS
  Filled 2014-06-27 (×2): qty 500

## 2014-06-27 MED ORDER — ACETAMINOPHEN 325 MG PO TABS: 650.0000 mg | ORAL_TABLET | ORAL | Status: DC | PRN

## 2014-06-27 MED ORDER — SODIUM CHLORIDE 0.9 % IV SOLN
2.0000 g | Freq: Four times a day (QID) | INTRAVENOUS | Status: DC
  Administered 2014-06-27 – 2014-07-04 (×28): 2 g via INTRAVENOUS
  Filled 2014-06-27 (×29): qty 2000

## 2014-06-27 NOTE — Progress Notes (Signed)
Dr Clearance CootsHarper notified of pt's complaints, SROM, orders received.

## 2014-06-27 NOTE — Consult Note (Signed)
Neonatology Consult Note:  At the request of the patients obstetrician Dr. Jodi Mourning I met with Wonda Olds who is at 53 1 weeks currently with pregnancy complicated by PPROM.  She received her first dose of BMZ today and is on magnesium for neuro prophylaxis.  She is on latency antibiotics. We discussed morbidity/mortality at this gestional age, delivery room resuscitation, including intubation and surfactant in DR.  Discussed mechanical ventilation and risk for chronic lung disease, risk for IVH with potential for motor / cognitive deficits, ROP, NEC, sepsis, as well as temperature instability and feeding immaturity.  Discussed NG / OG feeds, benefits of MBM in reducing incidence of NEC.   Discussed likely length of stay.  Thank you for allowing [place] to participate in her care.  Please call with questions.  Higinio Roger, DO  Neonatologist  The total length of face-to-face or floor / unit time for this encounter was 25 minutes.  Counseling and / or coordination of care was greater than fifty percent of the time.

## 2014-06-27 NOTE — MAU Note (Signed)
Fern slide with positve ferning

## 2014-06-27 NOTE — MAU Note (Signed)
Pt accompanied to restroom, large amt of clear water noted from vagina

## 2014-06-27 NOTE — Progress Notes (Signed)
Ur chart review completed.  

## 2014-06-27 NOTE — H&P (Signed)
Tonya Gray is a 33 y.o. female presenting for leaking fluid vaginally. Maternal Medical History:  Reason for admission: Rupture of membranes.   Fetal activity: Perceived fetal activity is normal.    Prenatal complications: no prenatal complications Prenatal Complications - Diabetes: none.    OB History   Grav Para Term Preterm Abortions TAB SAB Ect Mult Living   5 4 4  0 0 0 0 0 0 4     Past Medical History  Diagnosis Date  . No pertinent past medical history   . Medical history non-contributory    Past Surgical History  Procedure Laterality Date  . Wisdom tooth extraction    . No past surgeries     Family History: family history includes Cancer in her brother and maternal grandmother; Diabetes in her mother; Hypertension in her mother; Other in her daughter; Sickle cell trait in her daughter, daughter, and son. There is no history of Anesthesia problems. Social History:  reports that she has never smoked. She has never used smokeless tobacco. She reports that she does not drink alcohol or use illicit drugs.   Prenatal Transfer Tool  Maternal Diabetes: No Genetic Screening: Normal Maternal Ultrasounds/Referrals: Normal Fetal Ultrasounds or other Referrals:  None Maternal Substance Abuse:  No Significant Maternal Medications:  None Significant Maternal Lab Results:  None Other Comments:  None  Review of Systems  All other systems reviewed and are negative.     Blood pressure 124/67, pulse 97, temperature 97.9 F (36.6 C), temperature source Oral, resp. rate 20, height 5' 3.25" (1.607 m), weight 164 lb 7 oz (74.588 kg), last menstrual period 12/05/2013, SpO2 100.00%, unknown if currently breastfeeding. Maternal Exam:  Abdomen: Patient reports no abdominal tenderness. Fetal presentation: vertex  Introitus: Normal vulva. Normal vagina.  Ferning test: positive.  Amniotic fluid character: clear.  Cervix: Cervix evaluated by sterile speculum exam.     Physical Exam   Nursing note and vitals reviewed. Constitutional: She is oriented to person, place, and time. She appears well-developed and well-nourished.  HENT:  Head: Normocephalic and atraumatic.  Eyes: Conjunctivae are normal. Pupils are equal, round, and reactive to light.  Neck: Normal range of motion. Neck supple.  Cardiovascular: Normal rate and regular rhythm.   Respiratory: Effort normal.  GI: Soft.  Genitourinary: Vagina normal and uterus normal.  Musculoskeletal: Normal range of motion.  Neurological: She is alert and oriented to person, place, and time.  Skin: Skin is warm and dry.  Psychiatric: She has a normal mood and affect. Her behavior is normal. Judgment and thought content normal.    Prenatal labs: ABO, Rh: O/POS/-- (04/16 1042) Antibody: NEG (04/16 1042) Rubella: 2.71 (04/16 1042) RPR: NON REAC (06/26 1550)  HBsAg: NEGATIVE (04/16 1042)  HIV: NONREACTIVE (06/26 1550)  GBS:     Assessment/Plan: 29 weeks.  PPROM.  Admit.  Bedrest.  Magnesium sulfate neuro prophylaxis.  Steroids.  PPROM antibiotic prophylaxis.   Sira Adsit A 06/27/2014, 3:42 PM

## 2014-06-27 NOTE — Progress Notes (Signed)
I made an initial outreach to CulpNicole who was still taking it in that she was here in the hospital at only 29 weeks.  Our visit got cut short, and I did not get a chance to return, but we will check in with her over the next few days to see how she and her family are doing.  [address]Chaplain [place] Pager, [phone] 3:22 PM   06/27/14 1500  Clinical Encounter Type  Visited With Patient  Visit Type Initial

## 2014-06-28 NOTE — Progress Notes (Signed)
Patient ID: Odessa Flemingicole E Conly, female   DOB: 26-Feb-1981, 33 y.o.   MRN: [id number] Hospital Day: 2  S: Still leaking.  No UC's  O: Blood pressure 127/64, pulse 88, temperature 97.8 F (36.6 C), temperature source Oral, resp. rate 18, height 5' 3.25" (1.607 m), weight 164 lb 7 oz (74.588 kg), last menstrual period 12/05/2013, SpO2 98.00%, unknown if currently breastfeeding.   NWG:[id number]:Baseline: 130-140 bpm, Variability: Good {> 6 bpm), Accelerations: Reactive and Decelerations: Variable: mild Toco: None SVE:   A/P- 33 y.o. admitted with:  PPROM.  29.[redacted] weeks gestation.  Stable.  Continue bedrest.  Present on Admission:  . Preterm premature rupture of membranes (PPROM) with unknown onset of labor  Pregnancy Complications: none  Preterm labor management: pelvic rest advised and preterm labor education provided Dating:  3276w2d PNL Needed:  none FWB:  good PTL:  none

## 2014-06-28 NOTE — Progress Notes (Signed)
06/28/14 1500  Clinical Encounter Type  Visited With Patient and family together (SO Tonya Gray, children, mom)  Visit Type Follow-up;Spiritual support;Social support  Referral From Chaplain ([id number]Tonya Gray, [place], [place])  Spiritual Encounters  Spiritual Needs Emotional   Tonya Gray describes herself as "exhausted," but was in good spirits as her family visited this afternoon.  She values pastoral presence and looks forward to quieter 1:1 time with chaplain.  [place] will aim for morning follow-up tomorrow, but please also page as needs arise.  [address]Chaplain Tonya Thorstenson Boy[place]unde[place], [place] [phone]

## 2014-06-29 ENCOUNTER — Inpatient Hospital Stay (HOSPITAL_COMMUNITY): Payer: Medicaid Other

## 2014-06-29 NOTE — Progress Notes (Signed)
Patient ID: Tonya Gray, female   DOB: [birth year], 33 y.o.   MRN: [id number] Hospital Day: 3  S:  No complaints.  Still leaking,  No UC's.  O: Blood pressure 111/42, pulse 79, temperature 98.3 F (36.8 C), temperature source Oral, resp. rate 12, height 5' 3.25" (1.607 m), weight 164 lb 7 oz (74.588 kg), last menstrual period 12/05/2013, SpO2 98.00%, unknown if currently breastfeeding.   GEX:[id number]:Baseline: 130-140 bpm Toco: None SVE:   A/P- 33 y.o. admitted with:  PPROM.  Stable.  Continue bedrest.  Present on Admission:  . Preterm premature rupture of membranes (PPROM) with unknown onset of labor  Pregnancy Complications: none  Preterm labor management: bedrest advised Dating:  5644w3d PNL Needed:  none FWB:  good PTL:  stable

## 2014-06-29 NOTE — Consult Note (Signed)
MFM Consultation, Staff Note:  Impressions: Tonya Gray 33 y.o. [id number] with [facility] at 72w3dadmitted with diagnosis of pPROM I reviewed ultrasound images as follows: No dysmorphic features EFW is appropriate at 52nd% for assigned GA No previa Amniotic fluid volume is decreased No evidence of intraamniotic infection  Discussion: Tonya Gray has been diagnosed and admitted for preterm premature rupture of membranes as established by her obstetrical provider. I was asked to evaluate her fetus and provide recommendations regarding the management of this diagnosis. I explained to her that in order for labor to occur either preterm or term, there has to be membrane activation, uterine contractility and cervical dilation, which of these manifest primarily varies from individual to individual and frequently it is a combination of synchronous and asynchronous activations that lead to eventual preterm delivery. Any number of precipitating events in combination may be the basis or pathologic activation of any of these pathways. Regardless, I explained to her that at this point our primary concern was for ascending intrauterine/intraamniotic infection, which poses risk to her as well as her fetus. She understood that inpatient management was essential with serial examinations and fetal heart rate tracings to screen for onset of infection and that evidence of such would prompt delivery.  I explained to her timing of delivery in absence of infection and in presence of reassuring fetal status has been debated historically and recently by many experts. Regardless, I cited to her that the best available and most well-accepted timing of delivery in context of otherwise reassuring maternal-fetus status affected by pPROM is at 353weeks gestational age. This most fully balances the risk of prematurity against that of an occult intrauterine infection. Occult intrauterine infection is the second leading cause of  cerebral palsy as opposed to the leading cause which is, of course, prematurity. She seemed to grasp the concept as well as our specialty's rationale.  All of your patient's questions were addressed to her satisfaction today. Although this was a lot of information to take in, she and her husband demonstrated excellent comprehension of my impression, recommendations, and the underlying rationale.  Summary of Recommendations: 1. I agree with latency antibiotics course for 7 days as started; 2. Patient has received a course of betamethasone for reduction of fetal morbidity/mortality risk; If more than 14 days passes since administration of BMZ and there is reason to believe the patient may need delivery, I would consider a rescue course of steroids (but not more than two courses total for the pregnancy). 3. Timing of delivery should be anticipated by 34 weeks provided that testing remains reassuring and there is no evidence of chorioamnionitis. 4. I would deliver prior to 34 weeks for nonreassuring fetal testing, evidence of chorioamnionitis, or other evidence of maternal or fetal deterioration. 5. Interval growth should be continually reassessed every 3-4 weeks pregnancy additionally complicated by PPROM 6. The patient should be monitored by serial clinical exams, serial vital signs, daily EFM's/NST's (more often if clinically warranted), CBC only as clinically indicated (noting that WBC at or above 20,000 constitutes leukocytosis for pregnancy). 7. NICU consultation is recommended and previously completed. 8. Recommend DVT prophylaxis in setting of reduced ambulation; SCDs remain appropriate provided patient wears them at least 18 hours per day.  I advised the patient accordingly and if this is not met, I recommend LMWH 342msc daily up to 24 hours prior to delivery. 9. I recommend against bedrest with activity ad lib.  Physical therapy/in house yoga if available is  recommended and is useful for  maintaining patient conditioning. 10.  If delivery is anticipated prior to 32 weeks, I would repeat course of magnesium sulfate for 12 hours for fetal/neonatal neuroprophylaxis (CP prevention).  Thank you for consultation. It was a pleasure having the opportunity to contribute to the care of your patient. Please page with questions. I spent in excess of 60 minutes (Level IV) in consultation with your patient with more than 50% of this time in direct face-to-face counseling and education.  Thank you,  Delman Cheadle Harl Favor, Delman Cheadle, MD, MS, FACOG Assistant Professor Section of [place]

## 2014-06-30 LAB — TYPE AND SCREEN
ABO/RH(D): O POS
ANTIBODY SCREEN: NEGATIVE

## 2014-06-30 LAB — ABO/RH: ABO/RH(D): O POS

## 2014-06-30 NOTE — Progress Notes (Signed)
UR completed 

## 2014-06-30 NOTE — Progress Notes (Signed)
06/30/14 1500  Clinical Encounter Type  Visited With Patient not available  Visit Type Follow-up   Attempted follow-up visit, but pt sleeping.  [place] will continue to follow, but please also page as needs arise. Thank you.  [address] StreetChaplain Dallas Scorsone BlountstownLundeen, [place] DakotaMDiv [phone]

## 2014-07-01 NOTE — Progress Notes (Signed)
Patient ID: Tonya Gray, female   DOB: 09-20-1981, 33 y.o.   MRN: [id number] Hospital Day: 5  S: No complaints.  Still leaking pinkish fluid,.  Occasional cramping, mild.  + FM.  O: Blood pressure 124/55, pulse 77, temperature 98.3 F (36.8 C), temperature source Oral, resp. rate 20, height 5' 3.25" (1.607 m), weight 170 lb 1.6 oz (77.157 kg), last menstrual period 12/05/2013, SpO2 100.00%, unknown if currently breastfeeding. FHT:  140-150 bpm Toco: None SVE:   A/P- 33 y.o. admitted with:  PPROM.  Stable.  Continue bedrest.  Present on Admission:  . Preterm premature rupture of membranes (PPROM) with unknown onset of labor  Pregnancy Complications: PPROM  Preterm labor management: bedrest advised Dating:  6971w5d PNL Needed:  none FWB:  good PTL:  stable ROD: spontaneous vaginal

## 2014-07-02 NOTE — Progress Notes (Signed)
Patient ID: Tonya Gray, female   DOB: Sep 19, 1981, 33 y.o.   MRN: [id number] Vital signs normal Used 3 pads No cramping Continue present therapy

## 2014-07-03 MED ORDER — DIPHENHYDRAMINE HCL 25 MG PO CAPS
25.0000 mg | ORAL_CAPSULE | ORAL | Status: DC | PRN
  Administered 2014-07-03 – 2014-07-04 (×3): 25 mg via ORAL
  Filled 2014-07-03 (×3): qty 1

## 2014-07-03 NOTE — Progress Notes (Signed)
Patient ID: Tonya Gray, female   DOB: 12/25/1980, 33 y.o.   MRN: [id number] Vital signs normal Used 3 pads yesterday and you Says she has some cramping No change

## 2014-07-04 MED ORDER — NYSTATIN-TRIAMCINOLONE 100000-0.1 UNIT/GM-% EX CREA
TOPICAL_CREAM | Freq: Two times a day (BID) | CUTANEOUS | Status: DC
  Administered 2014-07-04 – 2014-07-05 (×4): via TOPICAL
  Filled 2014-07-04: qty 15

## 2014-07-04 NOTE — Progress Notes (Signed)
Antenatal Nutrition Assessment:  Currently  [redacted] weeks gestation, with PPROM. Height  63.25 "  Weight 170 lbs  pre-pregnancy weight 138 lbs .  Pre-pregnancy  BMI 24.3  IBW 116.25 lbs Total weight gain 32.lbs Weight gain goals 25-35 lbs Estimated needs: 2100-2300 kcal/day, 75 grams protein/day, 2.2-2.5 liters fluid/day  Regular diet tolerated well, appetite good. Current diet prescription will provide for increased needs. Snack menu and [employer] menu provided for increased options.  No abnormal nutrition related labs  Nutrition Dx: Increased nutrient needs r/t pregnancy and fetal growth requirements aeb [redacted] weeks gestation.  No educational needs assessed at this time.  Joaquin CourtsKimberly Harris, RD, LDN, CNSC Pager [phone] After Hours Pager [phone]

## 2014-07-04 NOTE — Progress Notes (Signed)
Patient ID: Tonya Gray, female   DOB: Nov 11, 1981, 33 y.o.   MRN: [id number] Hospital Day: 8  S: C/O rash on breast, itchy.  O: Blood pressure 129/52, pulse 84, temperature 97.9 F (36.6 C), temperature source Oral, resp. rate 18, height 5' 3.25" (1.607 m), weight 170 lb 1.6 oz (77.157 kg), last menstrual period 12/05/2013, SpO2 100.00%, unknown if currently breastfeeding.   WUJ:WJXBJYNWFHT:Baseline: 150 bpm Toco: None SVE:   A/P- 33 y.o. admitted with:  30 weeks.  PPROM.  Stable.  Continue bedrest.  Present on Admission:  . Preterm premature rupture of membranes (PPROM) with unknown onset of labor  Pregnancy Complications: PPROM  Preterm labor management: bedrest advised Dating:  5524w1d PNL Needed:  none FWB:  good PTL:  stable

## 2014-07-05 NOTE — Progress Notes (Signed)
Patient ID: Odessa Flemingicole E Lhommedieu, female   DOB: 1981-07-22, 33 y.o.   MRN: [id number] Hospital Day: 449  S: No complaints.  Still leaking pinkish fluid.  No UC's.  O: Blood pressure 140/64, pulse 98, temperature 98.2 F (36.8 C), temperature source Oral, resp. rate 18, height 5' 3.25" (1.607 m), weight 170 lb 1.6 oz (77.157 kg), last menstrual period 12/05/2013, SpO2 100.00%, unknown if currently breastfeeding.   NWG:NFAOZHYQFHT:Baseline: 130 bpm Toco: None SVE:   A/P- 33 y.o. admitted with:  PPROM.  Stable.  Continue bedrest.  Present on Admission:  . Preterm premature rupture of membranes (PPROM) with unknown onset of labor  Pregnancy Complications: PPROM  Preterm labor management: bedrest advised Dating:  1735w2d PNL Needed:  none FWB:  good PTL:  stable

## 2014-07-05 NOTE — Progress Notes (Signed)
UR completed 

## 2014-07-06 LAB — TYPE AND SCREEN
ABO/RH(D): O POS
ABO/RH(D): O POS
ANTIBODY SCREEN: NEGATIVE
Antibody Screen: NEGATIVE

## 2014-07-06 NOTE — Progress Notes (Signed)
\  Patient ID: Odessa Flemingicole E Balaban, female   DOB: 12-27-1980, 33 y.o.   MRN: [id number] Hospital Day: 4410  S: No complaints.  Still leaking pinkish fluid.  No UC's.  O: Blood pressure 132/65, pulse 85, temperature 98.5 F (36.9 C), temperature source Oral, resp. rate 18, height 5' 3.25" (1.607 m), weight 77.157 kg (170 lb 1.6 oz), last menstrual period 12/05/2013, SpO2 100.00%, unknown if currently breastfeeding.   WUJ:WJXBJYNWFHT:Baseline: 130 bpm Toco: None SVE: deferred  A/P- 33 y.o.   Present on Admission:  . Preterm premature rupture of membranes (PPROM) with unknown onset of labor  Pregnancy Complications: PPROM  Preterm labor management: bedrest advised Dating:  1664w3d  PNL Needed:  none FWB:  good PTL:  Stable Offer TDAP

## 2014-07-07 ENCOUNTER — Encounter: Payer: Self-pay | Admitting: *Deleted

## 2014-07-07 NOTE — Progress Notes (Signed)
Patient ID: Tonya Gray, female   DOB: [birth year], 33 y.o.   MRN: [id number] Hospital Day: 3611  S: No complaints.  O: Blood pressure 86/58, pulse 82, temperature 98 F (36.7 C), temperature source Oral, resp. rate 18, height 5' 3.25" (1.607 m), weight 167 lb 1.6 oz (75.796 kg), last menstrual period 12/05/2013, SpO2 100.00%, unknown if currently breastfeeding.   JYN:WGNFAOZHFHT:Baseline: 140 bpm Toco: None SVE:   A/P- 33 y.o. admitted with:  PPROM.  Stable.  Continue bedrest.  Present on Admission:  . Preterm premature rupture of membranes (PPROM) with unknown onset of labor  Pregnancy Complications: PPROM  Preterm labor management: bedrest advised Dating:  3731w4d PNL Needed:  none FWB:  good PTL:  stable

## 2014-07-07 NOTE — Progress Notes (Signed)
Discussed with patient after giving her the VIS information on Tdap yesterday and patient is refusing on the reason that it is against her religion.

## 2014-07-08 MED ORDER — SODIUM CHLORIDE 0.9 % IJ SOLN
3.0000 mL | Freq: Two times a day (BID) | INTRAMUSCULAR | Status: DC
  Administered 2014-07-09 – 2014-07-18 (×19): 3 mL via INTRAVENOUS

## 2014-07-08 NOTE — Progress Notes (Signed)
Patient ID: Odessa Flemingicole E Spaid, female   DOB: 03-04-1981, 33 y.o.   MRN: [id number] Hospital Day: 8512  S: No complaints.  Still leaking.  No cramping.  O: Blood pressure 136/59, pulse 98, temperature 98.6 F (37 C), temperature source Oral, resp. rate 18, height 5' 3.25" (1.607 m), weight 167 lb 1.6 oz (75.796 kg), last menstrual period 12/05/2013, SpO2 100.00%, unknown if currently breastfeeding.   WUJ:WJXBJYNWFHT:Baseline: 140 bpm Toco: None SVE:   A/P- 33 y.o. admitted with:  PPROM at 29 weeks.  Stable on bedrest.  Continue bedrest.  Present on Admission:  . Preterm premature rupture of membranes (PPROM) with unknown onset of labor  Pregnancy Complications: PPROM  Preterm labor management: bedrest advised Dating:  239w5d PNL Needed:  none FWB:  good PTL:  stable

## 2014-07-08 NOTE — Progress Notes (Signed)
UR completed 

## 2014-07-09 LAB — TYPE AND SCREEN
ABO/RH(D): O POS
Antibody Screen: NEGATIVE

## 2014-07-09 MED ORDER — DOCUSATE SODIUM 100 MG PO CAPS
100.0000 mg | ORAL_CAPSULE | Freq: Two times a day (BID) | ORAL | Status: DC
  Administered 2014-07-09 – 2014-07-18 (×17): 100 mg via ORAL
  Filled 2014-07-09 (×20): qty 1

## 2014-07-09 NOTE — Progress Notes (Signed)
Patient ID: Tonya Gray, female   DOB: 1981-08-09, 33 y.o.   MRN: [id number] Hospital Day: 3013  S: No complaints.  Still leaking.  No cramping.  O: Blood pressure 128/69, pulse 93, temperature 98.4 F (36.9 C), temperature source Oral, resp. rate 18, height 5' 3.25" (1.607 m), weight 75.796 kg (167 lb 1.6 oz), last menstrual period 12/05/2013, SpO2 100.00%, unknown if currently breastfeeding.   ZHY:QMVHQIONFHT:Baseline: 140 bpm Toco: None SVE: Deferred  A/P-   Present on Admission:  . Preterm premature rupture of membranes (PPROM) with unknown onset of labor  Pregnancy Complications: PPROM  Preterm labor management: modified bedrest advised Dating:  7956w6d  PNL Needed:  none FWB:  Category I FHT PTL:  stable

## 2014-07-11 MED ORDER — TERBUTALINE SULFATE 1 MG/ML IJ SOLN
INTRAMUSCULAR | Status: DC
Start: 2014-07-11 — End: 2014-07-11
  Filled 2014-07-11: qty 1

## 2014-07-11 NOTE — Progress Notes (Signed)
UR completed 

## 2014-07-11 NOTE — Progress Notes (Signed)
Patient ID: Tonya Gray, female   DOB: 14-Jun-1981, 33 y.o.   MRN: [id number] Hospital Day: 815  S: No complaints.  O: Blood pressure 129/67, pulse 89, temperature 98.9 F (37.2 C), temperature source Oral, resp. rate 18, height 5' 3.25" (1.607 m), weight 167 lb 1.6 oz (75.796 kg), last menstrual period 12/05/2013, SpO2 100.00%, unknown if currently breastfeeding.   JYN:WGNFAOZHFHT:Baseline: 130 bpm Toco: None SVE:   A/P- 33 y.o. admitted with:  Present on Admission:  . Preterm premature rupture of membranes (PPROM) with unknown onset of labor  Pregnancy Complications: PPROM  Preterm labor management: bedrest advised Dating:  3997w1d PNL Needed:  none FWB:  good PTL:  stable

## 2014-07-12 LAB — TYPE AND SCREEN
ABO/RH(D): O POS
Antibody Screen: NEGATIVE

## 2014-07-12 NOTE — Progress Notes (Signed)
Patient ID: Tonya Gray, female   DOB: 1981/05/17, 33 y.o.   MRN: [id number] Hospital Day: 316  S: No complaints.  O: Blood pressure 117/81, pulse 95, temperature 98.2 F (36.8 C), temperature source Oral, resp. rate 16, height 5' 3.25" (1.607 m), weight 167 lb 1.6 oz (75.796 kg), last menstrual period 12/05/2013, SpO2 100.00%, unknown if currently breastfeeding.   YQM:VHQIONGEFHT:Baseline: 130 bpm Toco: None SVE:   A/P- 33 y.o. admitted with:  PPROM.  Stable.  Continue bedrest.  Present on Admission:  . Preterm premature rupture of membranes (PPROM) with unknown onset of labor  Pregnancy Complications: PPROM  Preterm labor management: bedrest advised Dating:  3657w2d PNL Needed:  none FWB:  good PTL:  stable

## 2014-07-13 MED ORDER — OXYCODONE-ACETAMINOPHEN 5-325 MG PO TABS
2.0000 | ORAL_TABLET | Freq: Once | ORAL | Status: AC
  Administered 2014-07-13: 2 via ORAL
  Filled 2014-07-13: qty 2

## 2014-07-13 NOTE — Progress Notes (Signed)
Patient ID: Tonya Gray, female   DOB: [birth year], 33 y.o.   MRN: [id number] Hospital Day: 5617  S: No c/o.  O: Blood pressure 99/49, pulse 88, temperature 98.4 F (36.9 C), temperature source Oral, resp. rate 18, height 5' 3.25" (1.607 m), weight 167 lb 1.6 oz (75.796 kg), last menstrual period 12/05/2013, SpO2 100.00%, unknown if currently breastfeeding.   NWG:NFAOZHYQFHT:Baseline: 130-140 bpm Toco: None SVE:   A/P- 33 y.o. admitted with:  PPROM.  Stable.  Continue bedrest.  Present on Admission:  . Preterm premature rupture of membranes (PPROM) with unknown onset of labor  Pregnancy Complications: PPROM  Preterm labor management: bedrest advised Dating:  2544w3d PNL Needed:  none FWB:  good PTL:  stable

## 2014-07-13 NOTE — Progress Notes (Signed)
Pt returned from wheelchair ride c/o severe pain on lower right quadrant. Abdomen palpates mild to moderate tightening. Monitor applied.  Fetus remains active.

## 2014-07-13 NOTE — Progress Notes (Signed)
Patient request to not be disturbed until about noon today. States she did not sleep well last night. Reports fetus is active. Informed patient of need to monitor infant and patient states she is agreeable with this but will wait until she awakens. Will continue to do hourly rounding on patient for assessment. Patient also request all medication (colace and saline flush) to be put off until noon.

## 2014-07-14 NOTE — Progress Notes (Signed)
Patient ID: Odessa Flemingicole E Yuille, female   DOB: December 07, 1980, 33 y.o.   MRN: [id number] Hospital Day: 1818  S: No complaints.  O: Blood pressure 123/61, pulse 76, temperature 98.6 F (37 C), temperature source Oral, resp. rate 18, height 5' 3.25" (1.607 m), weight 167 lb 1.6 oz (75.796 kg), last menstrual period 12/05/2013, SpO2 100.00%, unknown if currently breastfeeding.   WUJ:WJXBJYNWFHT:Baseline: 140 bpm Toco: occasional UC SVE:   A/P- 33 y.o. admitted with:  PPROM.  Stable.  Continue bedrest.  Present on Admission:  . Preterm premature rupture of membranes (PPROM) with unknown onset of labor  Pregnancy Complications: PPROM  Preterm labor management: bedrest advised Dating:  3153w4d PNL Needed:  none FWB:  good PTL:  stable

## 2014-07-14 NOTE — Progress Notes (Signed)
UR completed 

## 2014-07-16 NOTE — Progress Notes (Signed)
Patient ID: Tonya Gray, female   DOB: [birth year], 33 y.o.   MRN: [id number] Hospital Day: 7020  S: No complaints.  O: Blood pressure 112/49, pulse 89, temperature 98.7 F (37.1 C), temperature source Oral, resp. rate 18, height 5' 3.25" (1.607 m), weight 167 lb (75.751 kg), last menstrual period 12/05/2013, SpO2 100.00%, unknown if currently breastfeeding.   QMV:HQIONGEXFHT:Baseline: 135 bpm Toco: None SVE:   A/P- 33 y.o. admitted with: PPROM at 29 weeks.  Stable on bedrest.  Continue bedrest.  Present on Admission:  . Preterm premature rupture of membranes (PPROM) with unknown onset of labor  Pregnancy Complications: PPROM  Preterm labor management: no treatment necessary Dating:  8358w6d PNL Needed:  none FWB:  good PTL:  stable

## 2014-07-17 NOTE — Progress Notes (Signed)
Patient ID: Tonya Flemingicole E Brummitt, female   DOB: Jun 20, 1981, 33 y.o.   MRN: [id number] Subjective:    Tonya Gray is a 33 y.o. female being seen today for her obstetrical visit. She is at 4267w0d gestation. Patient reports no complaints and still leaking clear fluid. Fetal movement: normal.  Problem List Items Addressed This Visit   None     Patient Active Problem List   Diagnosis Date Noted  . Preterm premature rupture of membranes (PPROM) with unknown onset of labor 06/27/2014  . Supervision of other normal pregnancy 05/27/2014  . 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 Lichen sclerosus 03/17/2014  . Sickle cell trait 05/26/2012   Objective:    BP 123/64  Pulse 95  Temp(Src) 98.5 F (36.9 C) (Oral)  Resp 20  Ht 5' 3.25" (1.607 m)  Wt 167 lb (75.751 kg)  BMI 29.33 kg/m2  SpO2 100%  LMP 12/05/2013 FHT:  135 BPM  Uterine Size: size equals dates  Presentation: unsure     Assessment:    Pregnancy @ 9967w0d weeks   Plan:     labs reviewed, problem list updated Consent signed. GBS sent TDAP offered  Rhogam given for RH negative Pediatrician: discussed. Infant feeding: plans to breastfeed. Maternity leave: discussed. Cigarette smoking: not discussed. Orders Placed This Encounter  Procedures  . US OB Comp + 14 Wk    Standing Status: Standing     Number of Occurrences: 1     Standing Expiration Date:     Order Specific Question:  What location should the exam be performed?    Answer:  [facility] ULTRASOUND    Order Specific Question:  Reason for Exam (SYMPTOM  OR DIAGNOSIS REQUIRED)    Answer:  PROM. growth & AFI  . Comprehensive metabolic panel    Standing Status: Standing     Number of Occurrences: 1     Standing Expiration Date:   . CBC on admission    Standing Status: Standing     Number of Occurrences: 1     Standing Expiration Date:   . Urinalysis, Routine w reflex microscopic    Standing Status: Standing     Number of Occurrences: 1     Standing Expiration Date:   .  Urine microscopic-add on    Standing Status: Standing     Number of Occurrences: 1     Standing Expiration Date:   . Diet regular    May order from retail menu.   NO PORK!    Standing Status: Standing     Number of Occurrences: 1     Standing Expiration Date:     Order Specific Question:  Room service appropriate?    Answer:  Yes    Order Specific Question:  Fluid consistency:    Answer:  Thin  . Notify physician (specify)    Notify physician for pulse less than 60 or greater than 120, respiratory rate less than 12 or greater than 28, temperature greater than 100.4, urinary output less than 30 ml/hr, systolic BP less than 80 or greater than 140, diastolic BP less than 40 or greater than 90.    Standing Status: Standing     Number of Occurrences: 20     Standing Expiration Date:   Marland Kitchen. Vital signs    While awake, respect sleep.    Standing Status: Standing     Number of Occurrences: 1     Standing Expiration Date:   . SCDs    Standing Status: Standing  Number of Occurrences: 1     Standing Expiration Date:     Order Specific Question:  Laterality    Answer:  Bilateral  . Fetal monitoring every shift x 30 minutes    Standing Status: Standing     Number of Occurrences: 1     Standing Expiration Date:   . Bed rest with bathroom privileges    Standing Status: Standing     Number of Occurrences: 1     Standing Expiration Date:   . Care order/instruction    Offer TDAP with pt education    Standing Status: Standing     Number of Occurrences: 1     Standing Expiration Date:   . Full code    Standing Status: Standing     Number of Occurrences: 1     Standing Expiration Date:   . Consult to maternal fetal care    Standing Status: Standing     Number of Occurrences: 1     Standing Expiration Date:     Order Specific Question:  Date Requested    Answer:  06/28/2014    Order Specific Question:  [place] Study Requested:    Answer:  OB Detail + 14    Order Specific Question:  Other  Study Requested:    Answer:  None    Order Specific Question:  Number of Fetus    Answer:  One    Order Specific Question:  Consult Type:    Answer:  Co-Management    Order Specific Question:  Indication for Consult    Answer:  29 weeks.  PPROM.  Marland Kitchen Consult to neonatology    Standing Status: Standing     Number of Occurrences: 1     Standing Expiration Date:     Order Specific Question:  Reason for Consult?    Answer:  29.[redacted] wk gestation, PPROM  . Sample to Blood Bank    Standing Status: Standing     Number of Occurrences: 1     Standing Expiration Date:   . Type and screen    Standing Status: Standing     Number of Occurrences: 11     Standing Expiration Date:   . ABO/Rh    Standing Status: Standing     Number of Occurrences: 1     Standing Expiration Date:   . Admit to Inpatient (patient's expected length of stay will be greater than 2 midnights)    Standing Status: Standing     Number of Occurrences: 1     Standing Expiration Date:     Order Specific Question:  Diagnosis    Answer:  Preterm premature rupture of membranes (PPROM) with unknown onset of labor [[id number]]    Order Specific Question:  Level of Care    Answer:  Antepartum [20]    Order Specific Question:  Admitting Physician    Answer:  Coral Ceo A [[id number]]    Order Specific Question:  Estimated length of stay    Answer:  past midnight tomorrow    Order Specific Question:  Certification:    Answer:  I certify this patient will need inpatient services for at least 2 midnights    Order Specific Question:  Attending Physician    Answer:  Coral Ceo A [[id number]]    Order Specific Question:  PT Class (Do Not Modify)    Answer:  Inpatient [101]    Order Specific Question:  PT Acc Code (Do Not Modify)    Answer:  Private [1]   Meds ordered this encounter  Medications  . acetaminophen (TYLENOL) tablet 650 mg    Sig:   . zolpidem (AMBIEN) tablet 5 mg    Sig:   . DISCONTD: docusate sodium (COLACE) capsule 100  mg    Sig:   . calcium carbonate (TUMS - dosed in mg elemental calcium) chewable tablet 400 mg of elemental calcium    Sig:   . prenatal multivitamin tablet 1 tablet    Sig:   . betamethasone acetate-betamethasone sodium phosphate (CELESTONE) injection 12 mg    Sig:   . magnesium bolus via infusion 4 g    Sig:   . DISCONTD: magnesium sulfate 40 grams in LR 500 mL OB infusion    Sig:   . DISCONTD: azithromycin (ZITHROMAX) tablet 500 mg    Sig:   . DISCONTD: ampicillin (OMNIPEN) 2 g in sodium chloride 0.9 % 50 mL IVPB    Sig:     Order Specific Question:  Antibiotic Indication:    Answer:  PPROM  . DISCONTD: lactated ringers infusion    Sig:   . diphenhydrAMINE (BENADRYL) capsule 25 mg    Sig:   . DISCONTD: nystatin-triamcinolone (MYCOLOG II) cream    Sig:   . sodium chloride 0.9 % injection 3 mL    Sig:   . docusate sodium (COLACE) capsule 100 mg    Sig:   . DISCONTD: terbutaline (BRETHINE) 1 MG/ML injection    Sig:     Marrion Coy   : cabinet override  . oxyCODONE-acetaminophen (PERCOCET/ROXICET) 5-325 MG per tablet 2 tablet    Sig:

## 2014-07-18 ENCOUNTER — Inpatient Hospital Stay (HOSPITAL_COMMUNITY): Payer: Medicaid Other

## 2014-07-18 LAB — TYPE AND SCREEN
ABO/RH(D): O POS
ABO/RH(D): O POS
ANTIBODY SCREEN: NEGATIVE
ANTIBODY SCREEN: NEGATIVE

## 2014-07-18 MED ORDER — PSYLLIUM 95 % PO PACK
1.0000 | PACK | Freq: Every day | ORAL | Status: DC
  Administered 2014-07-18: 1 via ORAL
  Filled 2014-07-18 (×2): qty 1

## 2014-07-18 MED ORDER — MAGNESIUM HYDROXIDE 400 MG/5ML PO SUSP: 30.0000 mL | Freq: Every day | ORAL | Status: DC | PRN

## 2014-07-18 NOTE — Progress Notes (Signed)
Patient ID: Tonya Gray, female   DOB: 09/06/1981, 33 y.o.   MRN: [id number] Hospital Day: 6922  S: C/O constipation.  Still leaking.  No cramping.  O: Blood pressure 115/64, pulse 78, temperature 98.3 F (36.8 C), temperature source Oral, resp. rate 18, height 5' 3.25" (1.607 m), weight 75.751 kg (167 lb), last menstrual period 12/05/2013, SpO2 100.00%, unknown if currently breastfeeding.   WUJ:WJXBJYNWFHT:Baseline: 140 bpm Toco: None SVE: Deferred  A/P-   Present on Admission:  . Preterm premature rupture of membranes (PPROM) with unknown onset of labor  Pregnancy Complications: PPROM  Preterm labor management: modified bedrest advised Dating: 5559w1d  PNL Needed:  none FWB:  Category I FHT PTL:  Stable  Oral laxative prn; counseled re: increased fiber in diet, fluids U/S for growth

## 2014-07-18 NOTE — Progress Notes (Signed)
Ur chart review completed.  

## 2014-07-19 ENCOUNTER — Encounter (HOSPITAL_COMMUNITY): Payer: Self-pay | Admitting: *Deleted

## 2014-07-19 ENCOUNTER — Encounter (HOSPITAL_COMMUNITY): Payer: Medicaid Other | Admitting: Anesthesiology

## 2014-07-19 ENCOUNTER — Inpatient Hospital Stay (HOSPITAL_COMMUNITY): Payer: Medicaid Other | Admitting: Anesthesiology

## 2014-07-19 ENCOUNTER — Inpatient Hospital Stay (HOSPITAL_COMMUNITY): Payer: Medicaid Other

## 2014-07-19 ENCOUNTER — Encounter (HOSPITAL_COMMUNITY)
Admission: AD | Disposition: A | Discharge status: Home / Self Care | Payer: Self-pay | Source: Ambulatory Visit | Attending: Obstetrics

## 2014-07-19 DIAGNOSIS — O321XX0 Maternal care for breech presentation, not applicable or unspecified: Secondary | ICD-10-CM

## 2014-07-19 DIAGNOSIS — Z98891 History of uterine scar from previous surgery: Secondary | ICD-10-CM

## 2014-07-19 LAB — CBC
HCT: 32.2 % — ABNORMAL LOW (ref 36.0–46.0)
HEMOGLOBIN: 10.9 g/dL — AB (ref 12.0–15.0)
MCH: 26.6 pg (ref 26.0–34.0)
MCHC: 33.9 g/dL (ref 30.0–36.0)
MCV: 78.5 fL (ref 78.0–100.0)
PLATELETS: 221 10*3/uL (ref 150–400)
RBC: 4.1 MIL/uL (ref 3.87–5.11)
RDW: 16.1 % — ABNORMAL HIGH (ref 11.5–15.5)
WBC: 21.2 10*3/uL — ABNORMAL HIGH (ref 4.0–10.5)

## 2014-07-19 SURGERY — Surgical Case
Anesthesia: General | Site: Abdomen

## 2014-07-19 MED ORDER — SCOPOLAMINE 1 MG/3DAYS TD PT72
MEDICATED_PATCH | TRANSDERMAL | Status: AC
  Filled 2014-07-19: qty 1

## 2014-07-19 MED ORDER — PROPOFOL 10 MG/ML IV BOLUS
INTRAVENOUS | Status: DC | PRN
  Administered 2014-07-19: 150 mg via INTRAVENOUS
  Administered 2014-07-19: 50 mg via INTRAVENOUS

## 2014-07-19 MED ORDER — SENNOSIDES-DOCUSATE SODIUM 8.6-50 MG PO TABS
2.0000 | ORAL_TABLET | ORAL | Status: DC
  Administered 2014-07-19 – 2014-07-21 (×3): 2 via ORAL
  Filled 2014-07-19 (×3): qty 2

## 2014-07-19 MED ORDER — HYDROMORPHONE HCL PF 1 MG/ML IJ SOLN
INTRAMUSCULAR | Status: AC
  Filled 2014-07-19: qty 1

## 2014-07-19 MED ORDER — FENTANYL CITRATE 0.05 MG/ML IJ SOLN
INTRAMUSCULAR | Status: AC
  Filled 2014-07-19: qty 5

## 2014-07-19 MED ORDER — HYDROMORPHONE HCL PF 1 MG/ML IJ SOLN
0.2500 mg | INTRAMUSCULAR | Status: DC | PRN
  Administered 2014-07-19 (×3): 0.5 mg via INTRAVENOUS

## 2014-07-19 MED ORDER — TETANUS-DIPHTH-ACELL PERTUSSIS 5-2.5-18.5 LF-MCG/0.5 IM SUSP: 0.5000 mL | Freq: Once | INTRAMUSCULAR | Status: DC

## 2014-07-19 MED ORDER — WITCH HAZEL-GLYCERIN EX PADS: 1.0000 "application " | MEDICATED_PAD | CUTANEOUS | Status: DC | PRN

## 2014-07-19 MED ORDER — ONDANSETRON HCL 4 MG/2ML IJ SOLN: 4.0000 mg | INTRAMUSCULAR | Status: DC | PRN

## 2014-07-19 MED ORDER — CITRIC ACID-SODIUM CITRATE 334-500 MG/5ML PO SOLN
ORAL | Status: AC
  Filled 2014-07-19: qty 15

## 2014-07-19 MED ORDER — MIDAZOLAM HCL 2 MG/2ML IJ SOLN
INTRAMUSCULAR | Status: DC | PRN
  Administered 2014-07-19: 2 mg via INTRAVENOUS

## 2014-07-19 MED ORDER — DIBUCAINE 1 % RE OINT: 1.0000 "application " | TOPICAL_OINTMENT | RECTAL | Status: DC | PRN

## 2014-07-19 MED ORDER — OXYCODONE-ACETAMINOPHEN 5-325 MG PO TABS
1.0000 | ORAL_TABLET | ORAL | Status: DC | PRN
  Administered 2014-07-20 (×2): 2 via ORAL
  Administered 2014-07-20 – 2014-07-22 (×5): 1 via ORAL
  Filled 2014-07-19: qty 2
  Filled 2014-07-19 (×2): qty 1
  Filled 2014-07-19: qty 2
  Filled 2014-07-19 (×5): qty 1

## 2014-07-19 MED ORDER — ACETAMINOPHEN 160 MG/5ML PO SOLN
975.0000 mg | Freq: Once | ORAL | Status: AC
  Administered 2014-07-19: 975 mg via ORAL

## 2014-07-19 MED ORDER — MENTHOL 3 MG MT LOZG: 1.0000 | LOZENGE | OROMUCOSAL | Status: DC | PRN

## 2014-07-19 MED ORDER — IBUPROFEN 600 MG PO TABS
600.0000 mg | ORAL_TABLET | Freq: Four times a day (QID) | ORAL | Status: DC
  Administered 2014-07-19 – 2014-07-22 (×12): 600 mg via ORAL
  Filled 2014-07-19 (×12): qty 1

## 2014-07-19 MED ORDER — LACTATED RINGERS IV SOLN
INTRAVENOUS | Status: DC | PRN
  Administered 2014-07-19: 09:00:00 via INTRAVENOUS

## 2014-07-19 MED ORDER — HYDROMORPHONE HCL PF 1 MG/ML IJ SOLN
INTRAMUSCULAR | Status: DC | PRN
  Administered 2014-07-19: 1 mg via INTRAVENOUS
  Administered 2014-07-19 (×2): 0.5 mg via INTRAVENOUS

## 2014-07-19 MED ORDER — FENTANYL CITRATE 0.05 MG/ML IJ SOLN
INTRAMUSCULAR | Status: DC | PRN
  Administered 2014-07-19 (×2): 25 ug via INTRAVENOUS
  Administered 2014-07-19 (×2): 50 ug via INTRAVENOUS
  Administered 2014-07-19: 25 ug via INTRAVENOUS
  Administered 2014-07-19 (×2): 100 ug via INTRAVENOUS
  Administered 2014-07-19: 25 ug via INTRAVENOUS

## 2014-07-19 MED ORDER — OXYTOCIN 40 UNITS IN LACTATED RINGERS INFUSION - SIMPLE MED: 62.5000 mL/h | INTRAVENOUS | Status: AC

## 2014-07-19 MED ORDER — OXYTOCIN 10 UNIT/ML IJ SOLN
INTRAMUSCULAR | Status: AC
  Filled 2014-07-19: qty 4

## 2014-07-19 MED ORDER — OXYCODONE-ACETAMINOPHEN 5-325 MG PO TABS
2.0000 | ORAL_TABLET | Freq: Once | ORAL | Status: AC
  Administered 2014-07-19: 2 via ORAL
  Filled 2014-07-19: qty 2

## 2014-07-19 MED ORDER — LACTATED RINGERS IV SOLN
INTRAVENOUS | Status: DC | PRN
  Administered 2014-07-19 (×3): via INTRAVENOUS

## 2014-07-19 MED ORDER — PRENATAL MULTIVITAMIN CH
1.0000 | ORAL_TABLET | Freq: Every day | ORAL | Status: DC
  Administered 2014-07-20 – 2014-07-22 (×3): 1 via ORAL

## 2014-07-19 MED ORDER — LANOLIN HYDROUS EX OINT: 1.0000 "application " | TOPICAL_OINTMENT | CUTANEOUS | Status: DC | PRN

## 2014-07-19 MED ORDER — SIMETHICONE 80 MG PO CHEW
80.0000 mg | CHEWABLE_TABLET | ORAL | Status: DC | PRN
  Filled 2014-07-19 (×2): qty 1

## 2014-07-19 MED ORDER — FERROUS SULFATE 325 (65 FE) MG PO TABS
325.0000 mg | ORAL_TABLET | Freq: Two times a day (BID) | ORAL | Status: DC
  Administered 2014-07-20 – 2014-07-22 (×5): 325 mg via ORAL
  Filled 2014-07-19 (×5): qty 1

## 2014-07-19 MED ORDER — SIMETHICONE 80 MG PO CHEW
80.0000 mg | CHEWABLE_TABLET | Freq: Three times a day (TID) | ORAL | Status: DC
  Administered 2014-07-19 – 2014-07-22 (×7): 80 mg via ORAL
  Filled 2014-07-19 (×5): qty 1

## 2014-07-19 MED ORDER — KETOROLAC TROMETHAMINE 30 MG/ML IJ SOLN
INTRAMUSCULAR | Status: AC
  Administered 2014-07-19: 30 mg via INTRAVENOUS
  Filled 2014-07-19: qty 1

## 2014-07-19 MED ORDER — LIDOCAINE HCL (CARDIAC) 20 MG/ML IV SOLN
INTRAVENOUS | Status: AC
  Filled 2014-07-19: qty 5

## 2014-07-19 MED ORDER — SUCCINYLCHOLINE CHLORIDE 20 MG/ML IJ SOLN
INTRAMUSCULAR | Status: AC
  Filled 2014-07-19: qty 10

## 2014-07-19 MED ORDER — SUCCINYLCHOLINE CHLORIDE 20 MG/ML IJ SOLN
INTRAMUSCULAR | Status: DC | PRN
Start: 2014-07-19 — End: 2014-07-19
  Administered 2014-07-19: 100 mg via INTRAVENOUS

## 2014-07-19 MED ORDER — HYDROMORPHONE 0.3 MG/ML IV SOLN
INTRAVENOUS | Status: DC
  Administered 2014-07-19: 12:00:00 via INTRAVENOUS
  Administered 2014-07-19: 0.6 mg via INTRAVENOUS
  Administered 2014-07-19: 2.1 mg via INTRAVENOUS
  Administered 2014-07-19: 3.6 mg via INTRAVENOUS
  Filled 2014-07-19: qty 25

## 2014-07-19 MED ORDER — SIMETHICONE 80 MG PO CHEW
80.0000 mg | CHEWABLE_TABLET | ORAL | Status: DC
  Administered 2014-07-20 – 2014-07-21 (×2): 80 mg via ORAL
  Filled 2014-07-19 (×2): qty 1

## 2014-07-19 MED ORDER — CEFAZOLIN SODIUM-DEXTROSE 2-3 GM-% IV SOLR
INTRAVENOUS | Status: AC
  Filled 2014-07-19: qty 50

## 2014-07-19 MED ORDER — DEXTROSE 5 % IV SOLN
2.0000 g | Freq: Four times a day (QID) | INTRAVENOUS | Status: AC
  Administered 2014-07-19 – 2014-07-21 (×8): 2 g via INTRAVENOUS
  Filled 2014-07-19 (×9): qty 2

## 2014-07-19 MED ORDER — CEFAZOLIN SODIUM-DEXTROSE 2-3 GM-% IV SOLR
INTRAVENOUS | Status: DC | PRN
  Administered 2014-07-19: 2 g via INTRAVENOUS

## 2014-07-19 MED ORDER — MIDAZOLAM HCL 2 MG/2ML IJ SOLN
INTRAMUSCULAR | Status: AC
  Filled 2014-07-19: qty 2

## 2014-07-19 MED ORDER — SCOPOLAMINE 1 MG/3DAYS TD PT72
MEDICATED_PATCH | TRANSDERMAL | Status: DC | PRN
  Administered 2014-07-19: 1 via TRANSDERMAL

## 2014-07-19 MED ORDER — SODIUM CHLORIDE 0.9 % IJ SOLN: 9.0000 mL | INTRAMUSCULAR | Status: DC | PRN

## 2014-07-19 MED ORDER — DIPHENHYDRAMINE HCL 25 MG PO CAPS: 25.0000 mg | ORAL_CAPSULE | Freq: Four times a day (QID) | ORAL | Status: DC | PRN

## 2014-07-19 MED ORDER — ACETAMINOPHEN 160 MG/5ML PO SOLN
ORAL | Status: AC
  Filled 2014-07-19: qty 40.6

## 2014-07-19 MED ORDER — ZOLPIDEM TARTRATE 5 MG PO TABS
5.0000 mg | ORAL_TABLET | Freq: Every evening | ORAL | Status: DC | PRN
Start: 2014-07-19 — End: 2014-07-22

## 2014-07-19 MED ORDER — 0.9 % SODIUM CHLORIDE (POUR BTL) OPTIME
TOPICAL | Status: DC | PRN
Start: 2014-07-19 — End: 2014-07-19
  Administered 2014-07-19: 1000 mL

## 2014-07-19 MED ORDER — NALOXONE HCL 0.4 MG/ML IJ SOLN
0.4000 mg | INTRAMUSCULAR | Status: DC | PRN
Start: 2014-07-19 — End: 2014-07-19

## 2014-07-19 MED ORDER — ONDANSETRON HCL 4 MG/2ML IJ SOLN
INTRAMUSCULAR | Status: DC | PRN
  Administered 2014-07-19: 4 mg via INTRAVENOUS

## 2014-07-19 MED ORDER — PROPOFOL 10 MG/ML IV EMUL
INTRAVENOUS | Status: AC
  Filled 2014-07-19: qty 20

## 2014-07-19 MED ORDER — LACTATED RINGERS IV SOLN
40.0000 [IU] | INTRAVENOUS | Status: DC | PRN
  Administered 2014-07-19: 40 [IU] via INTRAVENOUS

## 2014-07-19 MED ORDER — DEXAMETHASONE SODIUM PHOSPHATE 10 MG/ML IJ SOLN
INTRAMUSCULAR | Status: DC | PRN
  Administered 2014-07-19: 4 mg via INTRAVENOUS

## 2014-07-19 MED ORDER — LIDOCAINE HCL (CARDIAC) 20 MG/ML IV SOLN
INTRAVENOUS | Status: DC | PRN
Start: 2014-07-19 — End: 2014-07-19
  Administered 2014-07-19: 50 mg via INTRAVENOUS

## 2014-07-19 MED ORDER — KETOROLAC TROMETHAMINE 30 MG/ML IJ SOLN
30.0000 mg | Freq: Once | INTRAMUSCULAR | Status: AC
  Administered 2014-07-19: 30 mg via INTRAVENOUS

## 2014-07-19 MED ORDER — ONDANSETRON HCL 4 MG PO TABS: 4.0000 mg | ORAL_TABLET | ORAL | Status: DC | PRN

## 2014-07-19 MED ORDER — PRENATAL MULTIVITAMIN CH: 1.0000 | ORAL_TABLET | Freq: Every day | ORAL | Status: DC

## 2014-07-19 MED ORDER — LACTATED RINGERS IV SOLN
INTRAVENOUS | Status: DC
  Administered 2014-07-19: 18:00:00 via INTRAVENOUS

## 2014-07-19 SURGICAL SUPPLY — 44 items
ADH SKN CLS APL DERMABOND .7 (GAUZE/BANDAGES/DRESSINGS)
BLADE SURG 10 STRL SS (BLADE) ×4 IMPLANT
CANISTER WOUND CARE 500ML ATS (WOUND CARE) IMPLANT
CLAMP CORD UMBIL (MISCELLANEOUS) IMPLANT
CLOTH BEACON ORANGE TIMEOUT ST (SAFETY) ×2 IMPLANT
CONTAINER PREFILL 10% NBF 15ML (MISCELLANEOUS) ×2 IMPLANT
DERMABOND ADVANCED (GAUZE/BANDAGES/DRESSINGS)
DERMABOND ADVANCED .7 DNX12 (GAUZE/BANDAGES/DRESSINGS) ×1 IMPLANT
DRAPE LG THREE QUARTER DISP (DRAPES) IMPLANT
DRSG COVADERM 4X10 (GAUZE/BANDAGES/DRESSINGS) ×1 IMPLANT
DRSG OPSITE POSTOP 4X10 (GAUZE/BANDAGES/DRESSINGS) ×2 IMPLANT
DRSG VAC ATS LRG SENSATRAC (GAUZE/BANDAGES/DRESSINGS) IMPLANT
DRSG VAC ATS MED SENSATRAC (GAUZE/BANDAGES/DRESSINGS) IMPLANT
DRSG VAC ATS SM SENSATRAC (GAUZE/BANDAGES/DRESSINGS) IMPLANT
DURAPREP 26ML APPLICATOR (WOUND CARE) ×2 IMPLANT
ELECT REM PT RETURN 9FT ADLT (ELECTROSURGICAL) ×2
ELECTRODE REM PT RTRN 9FT ADLT (ELECTROSURGICAL) ×1 IMPLANT
EXTRACTOR VACUUM M CUP 4 TUBE (SUCTIONS) IMPLANT
GLOVE BIO SURGEON STRL SZ8 (GLOVE) ×5 IMPLANT
GOWN STRL REUS W/TWL LRG LVL3 (GOWN DISPOSABLE) ×4 IMPLANT
KIT ABG SYR 3ML LUER SLIP (SYRINGE) IMPLANT
NDL HYPO 25X5/8 SAFETYGLIDE (NEEDLE) ×1 IMPLANT
NEEDLE HYPO 25X5/8 SAFETYGLIDE (NEEDLE) ×2 IMPLANT
NS IRRIG 1000ML POUR BTL (IV SOLUTION) ×2 IMPLANT
PACK C SECTION WH (CUSTOM PROCEDURE TRAY) ×2 IMPLANT
PAD OB MATERNITY 4.3X12.25 (PERSONAL CARE ITEMS) ×2 IMPLANT
RTRCTR C-SECT PINK 25CM LRG (MISCELLANEOUS) ×2 IMPLANT
STAPLER VISISTAT 35W (STAPLE) IMPLANT
SUT GUT PLAIN 0 CT-3 TAN 27 (SUTURE) IMPLANT
SUT MNCRL 0 VIOLET CTX 36 (SUTURE) ×3 IMPLANT
SUT MNCRL AB 4-0 PS2 18 (SUTURE) IMPLANT
SUT MON AB 2-0 CT1 27 (SUTURE) ×2 IMPLANT
SUT MON AB 3-0 SH 27 (SUTURE)
SUT MON AB 3-0 SH27 (SUTURE) IMPLANT
SUT MONOCRYL 0 CTX 36 (SUTURE) ×5
SUT PDS AB 0 CTX 60 (SUTURE) IMPLANT
SUT PLAIN 2 0 XLH (SUTURE) IMPLANT
SUT VIC AB 0 CTX 36 (SUTURE) ×4
SUT VIC AB 0 CTX36XBRD ANBCTRL (SUTURE) IMPLANT
SUT VIC AB 2-0 CT1 27 (SUTURE)
SUT VIC AB 2-0 CT1 TAPERPNT 27 (SUTURE) IMPLANT
TOWEL OR 17X24 6PK STRL BLUE (TOWEL DISPOSABLE) ×3 IMPLANT
TRAY FOLEY CATH 14FR (SET/KITS/TRAYS/PACK) ×2 IMPLANT
WATER STERILE IRR 1000ML POUR (IV SOLUTION) ×2 IMPLANT

## 2014-07-19 NOTE — Op Note (Signed)
Cesarean Section Procedure Note   Tonya Gray   06/27/2014 - 07/19/2014  Indications: Breech Presentation .  PPROM.  Active Labor.  Pre-operative Diagnosis: Breech Presentation.PPROM.  Active Labor   Post-operative Diagnosis: Same   Surgeon: Madelyne Millikan A  Assistants:  Surgical Technician  Anesthesia: general  Procedure Details:  The patient was seen in the Holding Room. The risks, benefits, complications, treatment options, and expected outcomes were discussed with the patient. The patient concurred with the proposed plan, giving informed consent. The patient was identified as Tonya Gray and the procedure verified as C-Section Delivery. A Time Out was held and the above information confirmed.  After induction of anesthesia, the patient was draped and prepped in the usual sterile manner. A transverse incision was made and carried down through the subcutaneous tissue to the fascia. The fascial incision was made and extended transversely. The fascia was separated from the underlying rectus tissue superiorly and inferiorly. The peritoneum was identified and entered. The peritoneal incision was extended longitudinally. The utero-vesical peritoneal reflection was incised transversely and the bladder flap was bluntly freed from the lower uterine segment. A low transverse uterine incision was made. Delivered from footling breech presentation was a 1830 gram living newborn female infant(s). APGAR (1 MIN): 2   APGAR (5 MINS): 3   APGAR (10 MINS): 6    A cord ph was sent = 7.19. The umbilical cord was clamped and cut cord. A sample was obtained for evaluation. The placenta was removed Intact and appeared normal.  There was hemorrhage immediately after delivery of placenta from uterine atony and from the left angle extension.  The angles were clamped with ring forceps and the uterus responded well to debridement with a lap sponge and massage.   The uterine incision was closed with running locked  sutures of 0 Monocryl. A second imbricating layer of the same suture was placed.  Hemostasis was observed. The paracolic gutters were irrigated. The parieto peritoneum was closed in a running fashion with 2-0 Vicryl.  The fascia was then reapproximated with running sutures of 0 Vicryl.  The skin was closed with staples.  Instrument, sponge, and needle counts were correct prior the abdominal closure and were correct at the conclusion of the case.    Findings:  Normal uterus, ovaries and tubes.   Estimated Blood Loss:  1500ml  Total IV Fluids: 3200ml   Urine Output: 150CC OF clear urine  Specimens:  Placenta  Complications: no complications  Disposition: PACU - hemodynamically stable.  Maternal Condition: stable   Baby condition / location:  NICU    Signed: Surgeon(s): Brock Badharles A Nanie Dunkleberger, MD

## 2014-07-19 NOTE — Lactation Note (Signed)
This note was copied from the chart of Tonya Gray. Lactation Consultation Note      Initial consult with this mom of a NICU baby, now 6 hours old, and 32 2/[redacted] weeks gestation. Mom is an experienced breast feeder. This is her first pre term baby. Mom was exhausted, and could hardly keep her eyes open. Instead of showing mom how to use pump, I just did hand expression with mom, and was able to collect about 5 mls in a few minutes. The milk from mom's left breast looks like colostrum, and her right looks like milk! Mom wants to try tandem nursing with her 35 year old. I told her she probably could, but we would talk more about that later. Mom very happy to see milk. I talked a little about this baby being LPT, and how breast feeding this baby would take time, and be different. I told mom to have her nurse call for me later today, if needed. I left the NICU booklet on providing EBM or a NICU baby with mom, and the DEP set up for her to use. ILactation wuill follow up with mom either later today, or tomorrow.  Patient Name: Tonya Gray WUJWJ'XToday's Date: 07/19/2014 Reason for consult: Initial assessment;NICU baby;Infant < 6lbs   Maternal Data Formula Feeding for Exclusion: Yes (baby in NICU) Has patient been taught Hand Expression?: Yes Does the patient have breastfeeding experience prior to this delivery?: Yes  Feeding    LATCH Score/Interventions                      Lactation Tools Discussed/Used Tools: Pump Breast pump type: Double-Electric Breast Pump Initiated by:: clee RN LC at 6 hours post partum Date initiated:: 07/19/14   Consult Status Consult Status: Follow-up Date: 07/20/14 Follow-up type: In-patient    Alfred LevinsLee, Glennis Borger Anne 07/19/2014, 4:49 PM

## 2014-07-19 NOTE — Addendum Note (Signed)
Addendum created 07/19/14 1435 by Turner DanielsJennifer L Tait Balistreri, CRNA   Modules edited: Notes Section   Notes Section:  File: [id number]

## 2014-07-19 NOTE — Anesthesia Postprocedure Evaluation (Signed)
  Anesthesia Post-op Note  Patient: Tonya Gray  Procedure(s) Performed: Procedure(s): CESAREAN SECTION (N/A)  Patient is awake, responsive, moving her legs, and has signs of resolution of her numbness. Pain and nausea are reasonably well controlled. Vital signs are stable and clinically acceptable. Oxygen saturation is clinically acceptable. There are no apparent anesthetic complications at this time. Patient is ready for discharge.

## 2014-07-19 NOTE — Progress Notes (Signed)
Patient ID: Odessa Flemingicole E Spruce, female   DOB: May 26, 1981, 33 y.o.   MRN: [id number] Hospital Day: 8123  S: Preterm labor symptoms: fluid leakage, low back pain and pelvic pressure  O: Blood pressure 133/71, pulse 89, temperature 98.1 F (36.7 C), temperature source Axillary, resp. rate 18, height 5' 3.25" (1.607 m), weight 167 lb (75.751 kg), last menstrual period 12/05/2013, SpO2 97.00%, unknown if currently breastfeeding.   WUJ:WJXBJYNWFHT:Baseline: 150 bpm Toco: UC's GNF:[id number]:Presentation: Single Footling Breech (fetal parts felt in the vagina) Exam by:: Lynda RainwaterPamela Lawson RN   A/P- 33 y.o. admitted with:  PPROM at 29 weeks.  Now in active labor.  Footling Breech.  Will proceed with C/S.  Present on Admission:  . Preterm premature rupture of membranes (PPROM) with unknown onset of labor  Pregnancy Complications: PPROM  Preterm labor management: bedrest advised Dating:  4036w2d PNL Needed:  CBC FWB:  good PTL:  Active ROD: C-section with labor

## 2014-07-19 NOTE — Anesthesia Postprocedure Evaluation (Signed)
  Anesthesia Post-op Note  Anesthesia Post Note  Patient: Tonya Gray  Procedure(s) Performed: Procedure(s) (LRB): CESAREAN SECTION (N/A)  Anesthesia type: General  Patient location: [facility] Unit  Post pain: Pain level controlled  Post assessment: Post-op Vital signs reviewed  Last Vitals:  Filed Vitals:   07/19/14 1404  BP: 128/56  Pulse: 94  Temp: 36.6 C  Resp: 18    Post vital signs: Reviewed  Level of consciousness: sedated  Complications: No apparent anesthesia complications

## 2014-07-19 NOTE — Progress Notes (Signed)
PC received from MD and [facility]

## 2014-07-19 NOTE — Transfer of Care (Signed)
Immediate Anesthesia Transfer of Care Note  Patient: Tonya Gray  Procedure(s) Performed: Procedure(s): CESAREAN SECTION (N/A)  Patient Location: PACU  Anesthesia Type:General  Level of Consciousness: awake, alert , oriented and patient cooperative  Airway & Oxygen Therapy: Patient Spontanous Breathing and Patient connected to nasal cannula oxygen  Post-op Assessment: Report given to PACU RN and Post -op Vital signs reviewed and stable  Post vital signs: Reviewed and stable  Complications: No apparent anesthesia complications

## 2014-07-19 NOTE — Anesthesia Preprocedure Evaluation (Signed)
Anesthesia Evaluation  Patient identified by MRN, date of birth, ID band Patient awake    Reviewed: Allergy & Precautions, H&P , Patient's Chart, lab work & pertinent test results, reviewed documented beta blocker date and time   Airway Mallampati: II TM Distance: >3 FB Neck ROM: full    Dental no notable dental hx.    Pulmonary  breath sounds clear to auscultation  Pulmonary exam normal       Cardiovascular Rhythm:regular Rate:Normal     Neuro/Psych    GI/Hepatic   Endo/Other    Renal/GU      Musculoskeletal   Abdominal   Peds  Hematology   Anesthesia Other Findings   Reproductive/Obstetrics                           Anesthesia Physical Anesthesia Plan  ASA: II and emergent  Anesthesia Plan: General   Post-op Pain Management:    Induction: Intravenous, Rapid sequence and Cricoid pressure planned  Airway Management Planned: Oral ETT and Video Laryngoscope Planned  Additional Equipment:   Intra-op Plan:   Post-operative Plan: Extubation in OR  Informed Consent: I have reviewed the patients History and Physical, chart, labs and discussed the procedure including the risks, benefits and alternatives for the proposed anesthesia with the patient or authorized representative who has indicated his/her understanding and acceptance.   Dental Advisory Given and Dental advisory given  Plan Discussed with: CRNA and Surgeon  Anesthesia Plan Comments: (  Discussed general anesthesia, including possible nausea, instrumentation of airway, sore throat,pulmonary aspiration, etc. I asked if the were any outstanding questions, or  concerns before we proceeded. )        Anesthesia Quick Evaluation

## 2014-07-19 NOTE — Progress Notes (Addendum)
Last known fetal position was breech and MD coming to evaluate.  Amy Beard RN in the OR notified of the possibility of a c/s

## 2014-07-19 NOTE — Progress Notes (Signed)
MD notified that pt stating that she is feeling contractions and having diarrhea. Pt sitting on the toilet and discomfort feels better. Reviewed yesterdays u/s with MD, breech. Orders received and MD to come and evaluate

## 2014-07-20 ENCOUNTER — Encounter (HOSPITAL_COMMUNITY): Payer: Self-pay | Admitting: Obstetrics

## 2014-07-20 LAB — CBC
HCT: 23.4 % — ABNORMAL LOW (ref 36.0–46.0)
Hemoglobin: 7.8 g/dL — ABNORMAL LOW (ref 12.0–15.0)
MCH: 26.2 pg (ref 26.0–34.0)
MCHC: 33.3 g/dL (ref 30.0–36.0)
MCV: 78.5 fL (ref 78.0–100.0)
Platelets: 245 10*3/uL (ref 150–400)
RBC: 2.98 MIL/uL — ABNORMAL LOW (ref 3.87–5.11)
RDW: 16.2 % — AB (ref 11.5–15.5)
WBC: 20.3 10*3/uL — AB (ref 4.0–10.5)

## 2014-07-20 NOTE — Progress Notes (Signed)
Subjective: Postpartum Day 1: Cesarean Delivery Patient reports tolerating PO and no problems voiding.    Objective: Vital signs in last 24 hours: Temp:  [97.8 F (36.6 C)-98.9 F (37.2 C)] 98.7 F (37.1 C) (08/19 0547) Pulse Rate:  [83-114] 95 (08/19 0547) Resp:  [14-25] 18 (08/19 0547) BP: (114-142)/(56-90) 122/62 mmHg (08/19 0547) SpO2:  [94 %-100 %] 99 % (08/19 0547) FiO2 (%):  [43 %] 43 % (08/18 2158)  Physical Exam:  General: alert and no distress Lochia: appropriate Uterine Fundus: firm Incision: healing well DVT Evaluation: No evidence of DVT seen on physical exam.   Recent Labs  07/19/14 0750 07/20/14 0540  HGB 10.9* 7.8*  HCT 32.2* 23.4*    Assessment/Plan: Status post Cesarean section.   Anemia, from chronic iron deficiency, and intraoperative hemorrhage.  Stable.  Iron Rx Continue current care.  HARPER,CHARLES A 07/20/2014, 8:42 AM

## 2014-07-21 LAB — RPR

## 2014-07-21 MED ORDER — AMOXICILLIN-POT CLAVULANATE 875-125 MG PO TABS
1.0000 | ORAL_TABLET | Freq: Two times a day (BID) | ORAL | Status: DC
  Administered 2014-07-21 – 2014-07-22 (×2): 1 via ORAL
  Filled 2014-07-21 (×4): qty 1

## 2014-07-21 NOTE — Progress Notes (Signed)
Ur chart review completed.  

## 2014-07-21 NOTE — Lactation Note (Signed)
This note was copied from the chart of Tonya Gray. Lactation Consultation Note    Follow up consult with this mom fo a NICU baby, now 5454 hours old, and 32 4/7 weeks CGA. Mom has pumped a few times since the baby;s birth. She said she was sleeping all day yesterday, and is no longer taking percocet. I reviewed with her the importance of pumping at lest 8 times a day, around the clock, even if she is still breast feeding her 35 year old. Mom understood, and said she is now feeling better, and plans to pump every 3 hours. I told her to pump in standard setting now, for 15-30 minutes, until she stops dripping. Mom should go home tomorrow, and does have a DEP at home.   Patient Name: Tonya Gray ZOXWR'UToday's Date: 07/21/2014 Reason for consult: Follow-up assessment;NICU baby   Maternal Data    Feeding Feeding Type: Breast Milk  LATCH Score/Interventions                      Lactation Tools Discussed/Used Pump Review: Setup, frequency, and cleaning   Consult Status Consult Status: Follow-up Date: 07/22/14 Follow-up type: In-patient    Alfred LevinsLee, Jordana Dugue Anne 07/21/2014, 4:37 PM

## 2014-07-21 NOTE — Progress Notes (Signed)
07/21/14 1300  Clinical Encounter Type  Visited With Patient;Patient and family together  Visit Type Follow-up  Spiritual Encounters  Spiritual Needs Emotional   Joni Reiningicole was in great spirits and reports good support and very high confidence in baby's care.  Provided pastoral presence, reflective listening, and opportunity for her to share and process her birth story.  Family arrived at end of visit.  Family aware of ongoing chaplain availability, but please also page as needs arise.  Thank you.  [address]Chaplain Kailan Carmen DarfurLundeen, [place] [phone]

## 2014-07-22 MED ORDER — OXYCODONE-ACETAMINOPHEN 5-325 MG PO TABS: 1.0000 | ORAL_TABLET | ORAL | Status: DC | PRN

## 2014-07-22 MED ORDER — IBUPROFEN 600 MG PO TABS: 600.0000 mg | ORAL_TABLET | Freq: Four times a day (QID) | ORAL | Status: DC | PRN

## 2014-07-22 MED ORDER — FUSION PLUS PO CAPS: 1.0000 | ORAL_CAPSULE | Freq: Every day | ORAL | Status: DC

## 2014-07-22 NOTE — Discharge Summary (Signed)
Obstetric Discharge Summary Reason for Admission: rupture of membranes Prenatal Procedures: NST and ultrasound Intrapartum Procedures: cesarean: low cervical, transverse Postpartum Procedures: none Complications-Operative and Postpartum: none Hemoglobin  Date Value Ref Range Status  07/20/2014 7.8* 12.0 - 15.0 g/dL Final     DELTA CHECK NOTED     REPEATED TO VERIFY     HCT  Date Value Ref Range Status  07/20/2014 23.4* 36.0 - 46.0 % Final    Physical Exam:  General: alert and no distress Lochia: appropriate Uterine Fundus: firm Incision: healing well DVT Evaluation: No evidence of DVT seen on physical exam.  Discharge Diagnoses: Premature labor and Breech Presentation  Discharge Information: Date: 07/22/2014 Activity: pelvic rest Diet: routine Medications: PNV, Ibuprofen, Colace, Iron and Percocet Condition: stable Instructions: refer to practice specific booklet Discharge to: home Follow-up Information   Follow up with Jiraiya Mcewan A, MD In 1 week. (For wound re-check and removal of staples.)    Specialty:  Obstetrics and Gynecology   Contact information:   [address] [place] [address] [phone]       Newborn Data: Live born female  Birth Weight: 4 lb 0.6 oz (1830 g) APGAR: 2, 3  Home with NICU for prematurity.  Angelly Spearing A 07/22/2014, 9:33 AM

## 2014-07-22 NOTE — Progress Notes (Signed)
Discharge teaching complete. Pt pushed via wheelchair and discharged home to family.

## 2014-07-22 NOTE — Progress Notes (Signed)
Subjective: Postpartum Day 3: Cesarean Delivery Patient reports tolerating PO, + flatus, + BM and no problems voiding.    Objective: Vital signs in last 24 hours: Temp:  [98.4 F (36.9 C)-98.9 F (37.2 C)] 98.9 F (37.2 C) (08/21 0500) Pulse Rate:  [74-89] 77 (08/21 0500) Resp:  [18] 18 (08/21 0500) BP: (111-128)/(49-65) 113/49 mmHg (08/21 0500) SpO2:  [98 %-100 %] 98 % (08/21 0500)  Physical Exam:  General: alert and no distress Lochia: appropriate Uterine Fundus: firm Incision: healing well DVT Evaluation: No evidence of DVT seen on physical exam.   Recent Labs  07/20/14 0540  HGB 7.8*  HCT 23.4*    Assessment/Plan: Status post Cesarean section. Doing well postoperatively.  Discharge home with standard precautions and return to clinic in 1 weeks.  Kariann Wecker A 07/22/2014, 9:29 AM

## 2014-07-22 NOTE — Progress Notes (Signed)
Pt verbalizes understanding of d/c instructions, medications, follow up appts and when to seek medical care. Pt has no questions at this time. IV d/c without complications. Reminded of belongings policy, and to check room thoroughly for belongings. Pt being d/c home with family, will be returning frequently to provide breast milk for infant in NICU. Sheryn BisonGordon, Keyaria Lawson Warner

## 2014-07-26 ENCOUNTER — Encounter: Payer: Self-pay | Admitting: Obstetrics

## 2014-07-26 ENCOUNTER — Ambulatory Visit (INDEPENDENT_AMBULATORY_CARE_PROVIDER_SITE_OTHER): Payer: Medicaid Other | Admitting: Obstetrics

## 2014-07-26 NOTE — Progress Notes (Signed)
Subjective:     Tonya Gray is a 33 y.o. female who presents for a postpartum visit. She is 1 week postpartum following a low cervical transverse Cesarean section. I have fully reviewed the prenatal and intrapartum course. The delivery was at 32 gestational weeks. Outcome: primary cesarean section, low transverse incision. Anesthesia: general. Postpartum course has been normal. Baby's course has been normal. Baby is feeding by breast. Bleeding thin lochia. Bowel function is normal. Bladder function is normal. Patient is not sexually active. Contraception method is abstinence. Postpartum depression screening: negative.  Tobacco, alcohol and substance abuse history reviewed.  Adult immunizations reviewed including TDAP, rubella and varicella.  The following portions of the patient's history were reviewed and updated as appropriate: allergies, current medications, past family history, past medical history, past social history, past surgical history and problem list.  Review of Systems A comprehensive review of systems was negative.   Objective:    BP 118/77  Pulse 71  Temp(Src) 98.5 F (36.9 C)  Wt 159 lb (72.122 kg)  LMP 12/05/2013  General:  alert and no distress   Breasts:  inspection negative, no nipple discharge or bleeding, no masses or nodularity palpable  Lungs: not examined  Heart:  not examined  Abdomen: normal findings: soft, non-tender.  Incision C, D, I.   Staples removed and steri strips applied.   Vulva:  not examined  Vagina:  not examined  Cervix:  not examined  Corpus: not examined  Adnexa:  not evaluated  Rectal Exam: Not performed.           Assessment:     Normal postpartum exam. Pap smear not done at today's visit.  Plan:    1. Contraception: abstinence 2. Continue PNV's. 3. Follow up in: 2 weeks or as needed.   Healthy lifestyle practices reviewed

## 2014-08-10 ENCOUNTER — Ambulatory Visit (INDEPENDENT_AMBULATORY_CARE_PROVIDER_SITE_OTHER): Payer: Medicaid Other | Admitting: Obstetrics

## 2014-08-10 ENCOUNTER — Encounter: Payer: Self-pay | Admitting: Obstetrics

## 2014-08-10 DIAGNOSIS — Z3009 Encounter for other general counseling and advice on contraception: Secondary | ICD-10-CM

## 2014-08-10 MED ORDER — MEDROXYPROGESTERONE ACETATE 150 MG/ML IM SUSP: 150.0000 mg | INTRAMUSCULAR | Status: DC

## 2014-08-10 NOTE — Progress Notes (Signed)
Patient ID: Tonya Gray, female   DOB: Sep 30, 1981, 33 y.o.   MRN: [id number] Patient states she is doing well- she is going to start "nuzzling" at the hospital today to get the infant to start feeding. Patient states her incision is healing well.

## 2014-08-10 NOTE — Progress Notes (Signed)
Subjective:     Tonya Gray is a 33 y.o. female who presents for a postpartum visit. She is 3 weeks postpartum following a low cervical transverse Cesarean section. I have fully reviewed the prenatal and intrapartum course. The delivery was at 32 gestational weeks. Outcome: primary cesarean section, low transverse incision. Anesthesia: general. Postpartum course has been normal. Baby's course has been NICU. Baby is feeding by bottle Rush Barer. Bleeding thin lochia. Bowel function is normal. Bladder function is normal. Patient is not sexually active. Contraception method is abstinence. Postpartum depression screening: negative.  Tobacco, alcohol and substance abuse history reviewed.  Adult immunizations reviewed including TDAP, rubella and varicella.  The following portions of the patient's history were reviewed and updated as appropriate: allergies, current medications, past family history, past medical history, past social history, past surgical history and problem list.  Review of Systems A comprehensive review of systems was negative.   Objective:    BP 107/68  Pulse 65  Temp(Src) 98 F (36.7 C)  Ht  (1.6 m)  Wt 157 lb (71.215 kg)  BMI 27.82 kg/m2  Breastfeeding? Yes   PE:        Abdomen:  Soft, NT.  Incision C, D, I.    Assessment:     Normal postpartum exam. Pap smear not done at today's visit.  Plan:    1. Contraception: Depo-Provera injections 2. Depo Provera Rx 3. Follow up in: 4 weeks or as needed.  2hr GTT for h/o GDM/screening for DM q 3 yrs per ADA recommendations Preconception counseling provided Healthy lifestyle practices reviewed

## 2014-08-12 ENCOUNTER — Ambulatory Visit (INDEPENDENT_AMBULATORY_CARE_PROVIDER_SITE_OTHER): Payer: Medicaid Other | Admitting: *Deleted

## 2014-08-12 VITALS — BP 109/72 | HR 67 | Temp 98.4°F | Wt 153.0 lb

## 2014-08-12 DIAGNOSIS — Z3049 Encounter for surveillance of other contraceptives: Secondary | ICD-10-CM

## 2014-08-12 DIAGNOSIS — Z3009 Encounter for other general counseling and advice on contraception: Secondary | ICD-10-CM

## 2014-08-12 DIAGNOSIS — Z30013 Encounter for initial prescription of injectable contraceptive: Secondary | ICD-10-CM

## 2014-08-12 MED ORDER — MEDROXYPROGESTERONE ACETATE 150 MG/ML IM SUSP
150.0000 mg | Freq: Once | INTRAMUSCULAR | Status: AC
  Administered 2014-08-12: 150 mg via INTRAMUSCULAR

## 2014-08-12 NOTE — Progress Notes (Signed)
Pt is in office today for depo injection after recent delivery.  Pt was seen in office last week and advised to start depo per Dr Clearance Coots.  Pt states that she has not had intercourse up to this point postpartum.  Pt states that she is currently still bleeding post delivery.   Injection was given in RUOQ.  Pt tolerated well.  Pt advised to RTO on 11-03-14 for next injection.   BP 109/72  Pulse 67  Temp(Src) 98.4 F (36.9 C)  Wt 153 lb (69.4 kg)   Administrations This Visit   medroxyPROGESTERone (DEPO-PROVERA) injection 150 mg   Administered Action Dose Route Administered By   08/12/2014 Given 150 mg Intramuscular Lanney Gins, CMA

## 2014-09-05 IMAGING — US US OB FOLLOW-UP
1 of 2 series · 12 of 28 positions shown · non-contrast
Comparison: none

[Series 1: us ob follow up · 44 acquisitions, 12 frames shown]
[im 2/44]
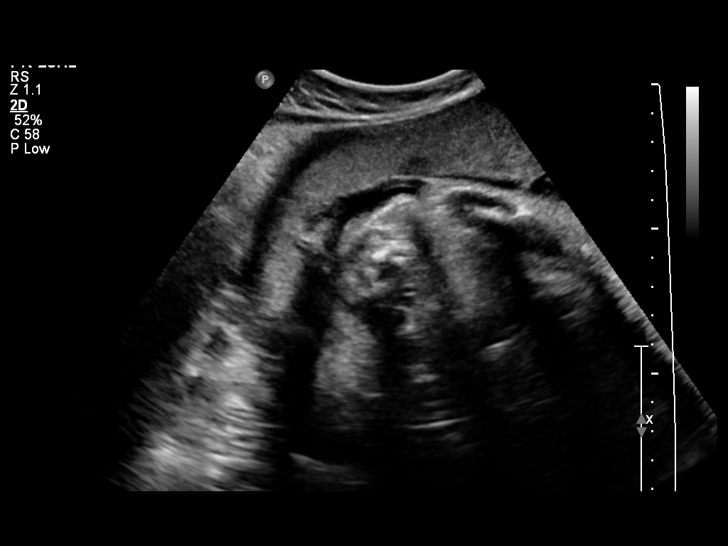
[im 5/44]
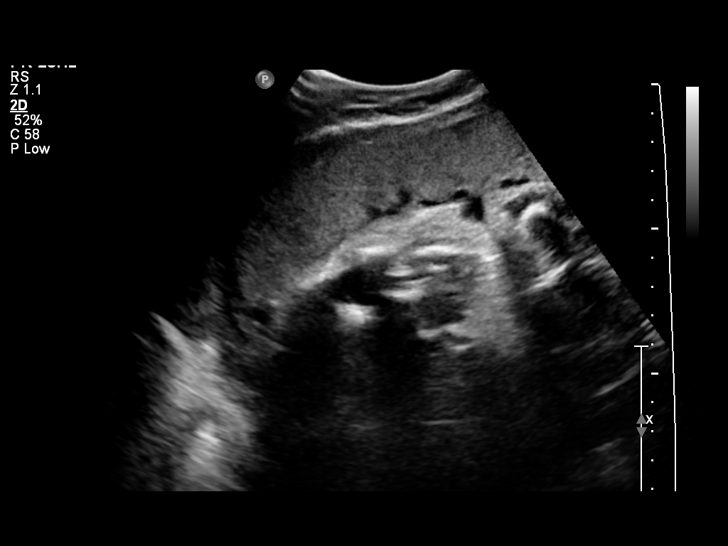
[im 9/44]
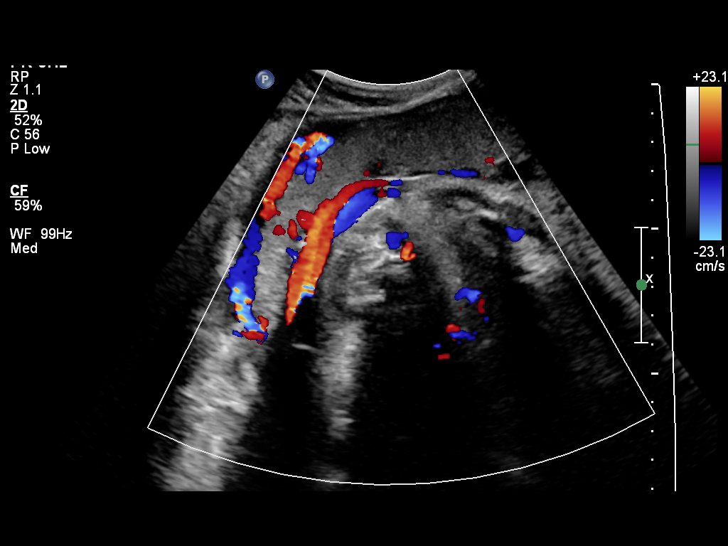
[im 14/44]
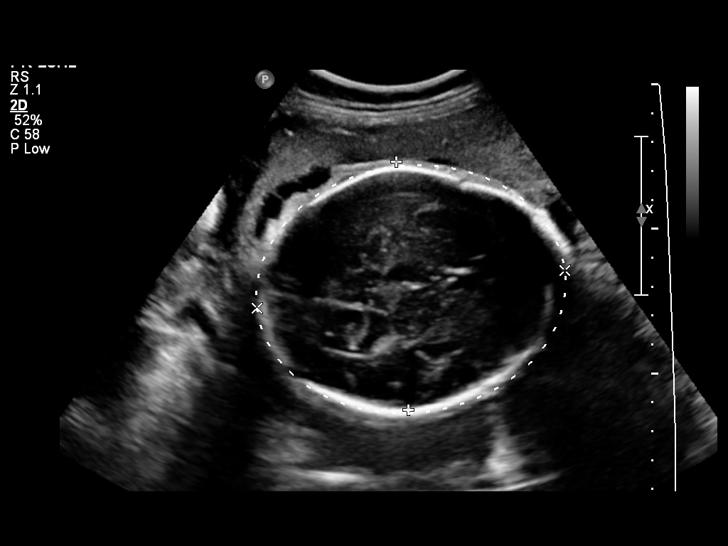
[im 17/44]
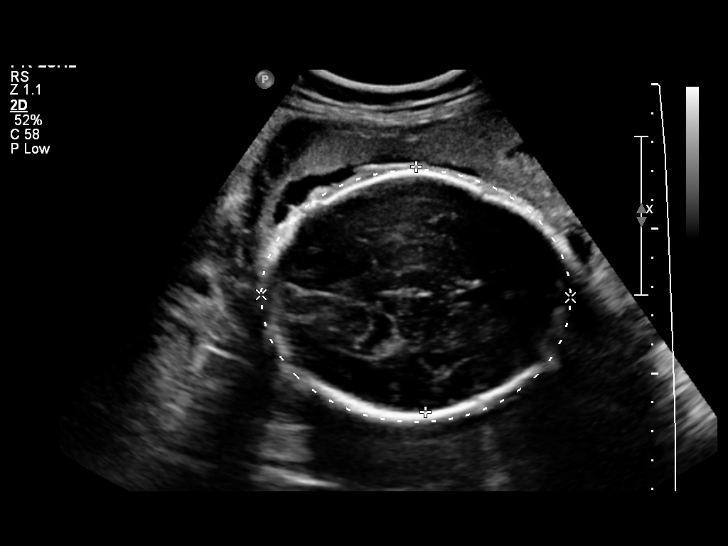
[im 20/44]
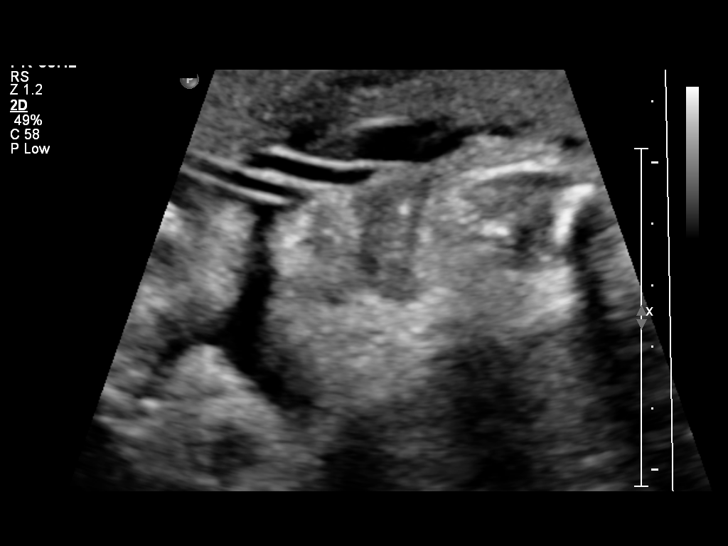
[im 25/44]
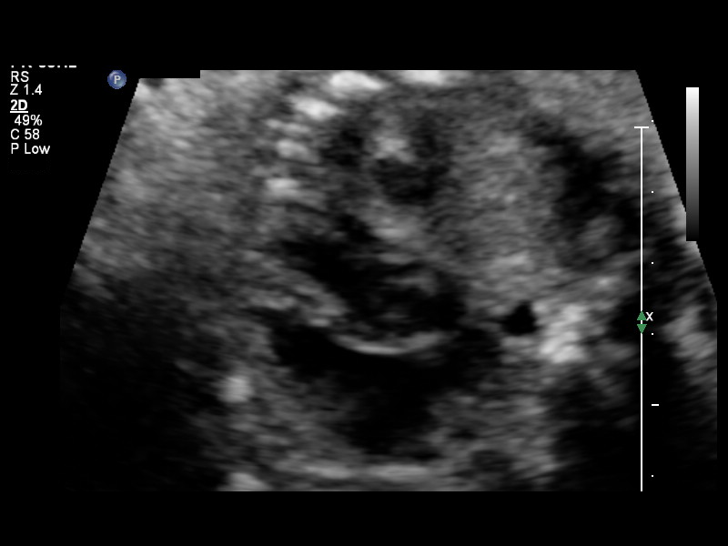
[im 29/44]
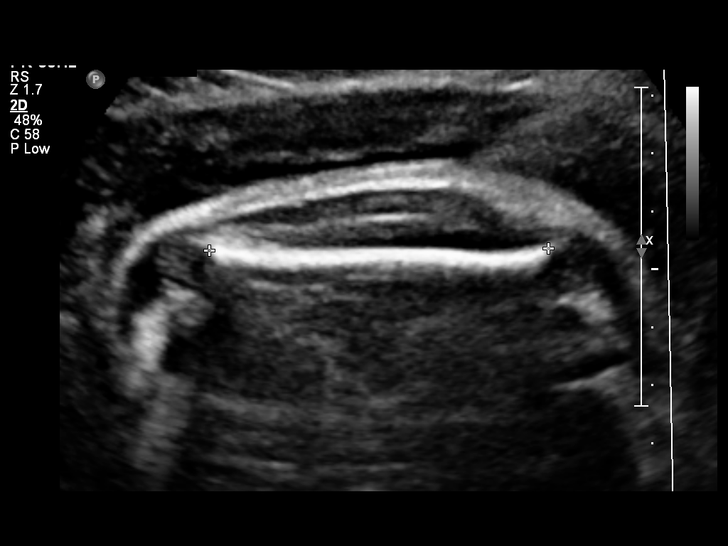
[im 32/44]
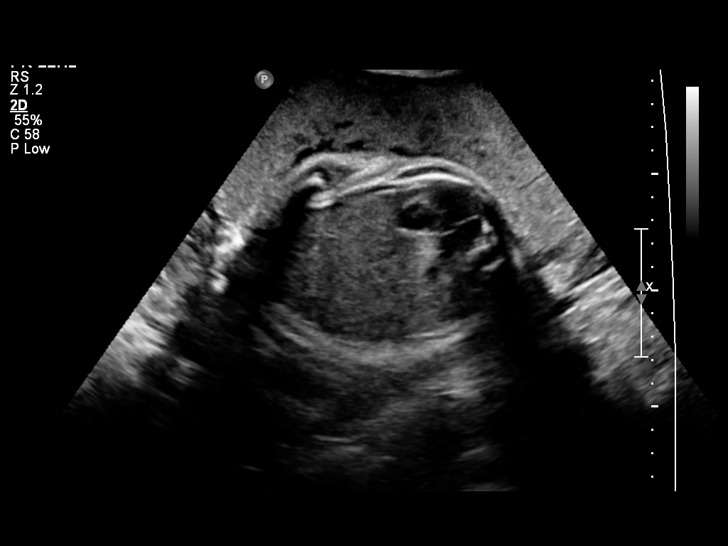
[im 37/44]
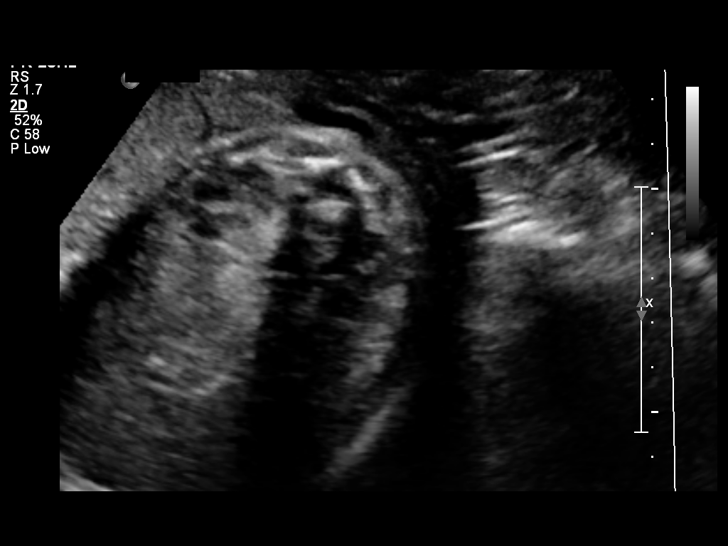
[im 40/44]
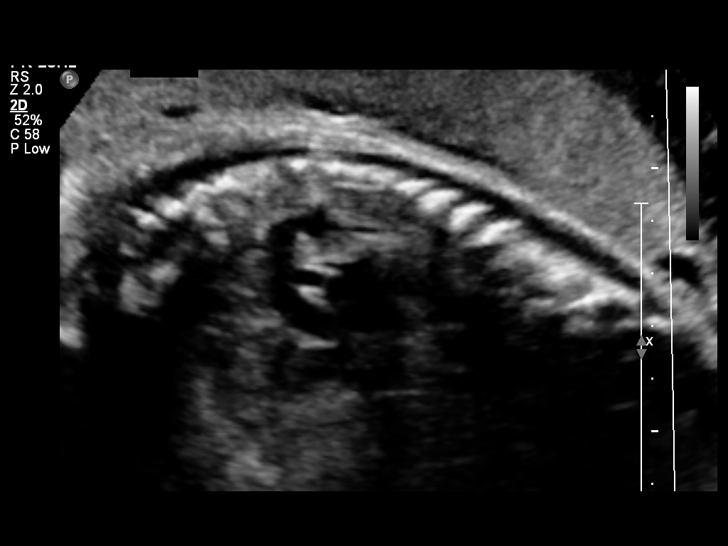
[im 44/44]
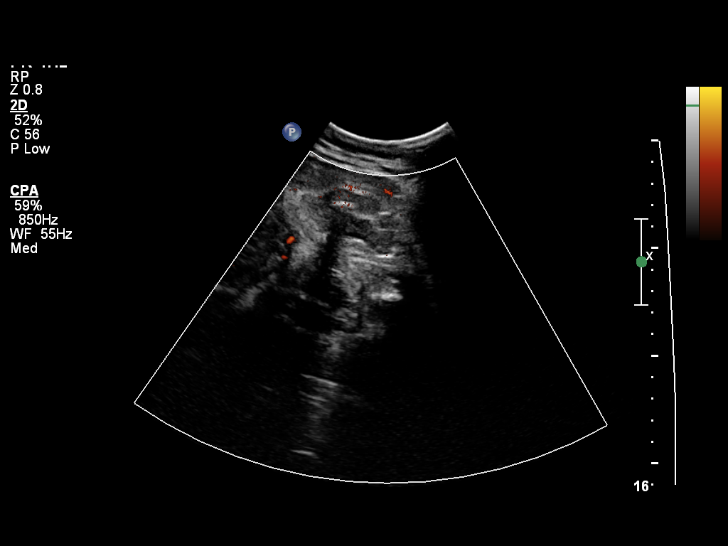

[12 of 28 positions shown; findings below may reference images not displayed]

OBSTETRICS REPORT
                      (Signed Final 07/18/2014 [DATE])

Service(s) Provided

 US OB FOLLOW UP                                       76816.1
Indications

 Premature rupture of membranes - leaking fluid
Fetal Evaluation

 Num Of Fetuses:    1
 Fetal Heart Rate:  142                          bpm
 Cardiac Activity:  Observed
 Presentation:      Breech
 Placenta:          Anterior, above cervical os
 P. Cord            Previously Visualized
 Insertion:

 Amniotic Fluid
 AFI FV:      Severe oligohydramnios
Biometry

 BPD:     84.9  mm     G. Age:  34w 1d                CI:        78.73   70 - 86
                                                      FL/HC:      19.2   19.1 -

 HC:     302.6  mm     G. Age:  33w 4d       52  %    HC/AC:      1.00   0.96 -

 AC:     301.2  mm     G. Age:  34w 1d       92  %    FL/BPD:     68.3   71 - 87
 FL:        58  mm     G. Age:  30w 2d        5  %    FL/AC:      19.3   20 - 24

 Est. FW:    0749  gm    4 lb 10 oz      73  %
Gestational Age

 LMP:           32w 1d        Date:  12/05/13                 EDD:   09/11/14
 Clinical EDD:  32w 1d                                        EDD:   09/11/14
 U/S Today:     33w 0d                                        EDD:   09/05/14
 Best:          32w 1d     Det. By:  LMP  (12/05/13)          EDD:   09/11/14
Anatomy

 Cranium:          Appears normal         Aortic Arch:      Appears normal
 Fetal Cavum:      Previously seen        Ductal Arch:      Appears normal
 Ventricles:       Appears normal         Diaphragm:        Appears normal
 Choroid Plexus:   Previously seen        Stomach:          Appears normal, left
                                                            sided
 Cerebellum:       Previously seen        Abdomen:          Appears normal
 Posterior Fossa:  Previously seen        Abdominal Wall:   Not well visualized
 Nuchal Fold:      Not applicable (>20    Cord Vessels:     Appears normal (3
                   wks GA)                                  vessel cord)
 Face:             Orbits previously      Kidneys:          Appear normal
                   seen
 Lips:             Appears normal         Bladder:          Appears normal
 Heart:            Appears normal         Spine:            Previously seen
                   (4CH, axis, and
                   situs)
 RVOT:             Not well visualized    Lower             Previously seen
                                          Extremities:
 LVOT:             Not well visualized    Upper             Previously seen
                                          Extremities:

 Other:  Technically difficult due to advanced GA and fetal position. Ankles
         and hands not well visualized.
Cervix Uterus Adnexa

 Cervix:       Not visualized (advanced GA >50wks)
Impression

 SIUP at 32+1 weeks
 Breech presentation
 Normal interval anatomy; limited views of outflow tracts and CI
 Severe oligohydramnios
 Appropriate interval growth with EFW at the 73rd %tile (2,095
 grams)
Recommendations

 Follow-up as clinically indicated

 questions or concerns.

## 2014-09-06 ENCOUNTER — Ambulatory Visit: Payer: Self-pay

## 2014-09-06 NOTE — Lactation Note (Signed)
This note was copied from the chart of Tonya Roniqua Kintz. Lactation Consultation Note    Follow up consuslt with this mom of a NICU baby, now 32 weeks old, and 39 2/7 weeks CGA. I met mom in the NICU at 12 noon. Mom was going to try and breast feed the baby for the first time, but the baby was too sleepy, so mom decided to wait. Mom may be back latertoday, and I will assist herwith latching if so. The baby has been treated for nasal MRSA, and I was called by Daphane Shepherd, to ask if any special breast care should be done in this situation. I suggested mom do routine care, no soap, apply EBM. The baby has been treated, and mom has no signs or symptoms of any nipple or breats tinfection at this time. I was concerned that if mom began using soap, etc, on her breast, she would be  More susceptible to drying and cracking, and thus more at risk for an infection. In speaking to mom, and experienced breast feeder, she agreed with this. Mom is very excited about putting baby to breast, but is waiting until baby is also ready.   Patient Name: Tonya Gray [id number] Date: 09/06/2014 Reason for consult: Follow-up assessment;NICU baby   Maternal Data    Feeding    LATCH Score/Interventions                      Lactation Tools Discussed/Used     Consult Status Consult Status: PRN Follow-up type: In-patient (NICU)    Tonna Corner 09/06/2014, 1:08 PM

## 2014-09-07 ENCOUNTER — Ambulatory Visit (INDEPENDENT_AMBULATORY_CARE_PROVIDER_SITE_OTHER): Payer: Medicaid Other | Admitting: Obstetrics

## 2014-09-07 ENCOUNTER — Encounter: Payer: Self-pay | Admitting: Obstetrics

## 2014-09-07 NOTE — Progress Notes (Signed)
Subjective:     Tonya Gray is a 33 y.o. female who presents for a postpartum visit. She is 7 weeks postpartum following a low cervical transverse Cesarean section at 32 weeks for PPROM in active labor, Breech presentation. I have fully reviewed the prenatal and intrapartum course. The delivery was at 32 gestational weeks. Outcome: primary cesarean section, low transverse incision. Anesthesia: general. Postpartum course has been normal. Baby's course has been complicated by prematurity, doing well in NICU. Baby is feeding by pumping for bottle. Bleeding no bleeding. Bowel function is normal. Bladder function is normal. Patient is sexually active. Contraception method is Depo-Provera injections. Postpartum depression screening: negative.  Tobacco, alcohol and substance abuse history reviewed.  Adult immunizations reviewed including TDAP, rubella and varicella.  The following portions of the patient's history were reviewed and updated as appropriate: allergies, current medications, past family history, past medical history, past social history, past surgical history and problem list.  Review of Systems A comprehensive review of systems was negative.   Objective:    BP 115/75  Pulse 63  Temp(Src) 98 F (36.7 C)  Ht 5\' 3"  (1.6 m)  Wt 155 lb (70.308 kg)  BMI 27.46 kg/m2  Breastfeeding? Yes  General:  alert and no distress   Breasts:  inspection negative, no nipple discharge or bleeding, no masses or nodularity palpable  Lungs: clear to auscultation bilaterally  Heart:  regular rate and rhythm, S1, S2 normal, no murmur, click, rub or gallop  Abdomen: normal findings: soft, non-tender   Vulva:  normal  Vagina: normal vagina  Cervix:  no cervical motion tenderness  Corpus: normal size, contour, position, consistency, mobility, non-tender  Adnexa:  no mass, fullness, tenderness  Rectal Exam: Not performed.           Assessment:     Normal postpartum exam. Pap smear not done at today's  visit.  Plan:    1. Contraception: Depo-Provera injections 2. Continue PNV's 3. Follow up in: 4 weeks or as needed.   Healthy lifestyle practices reviewed

## 2014-10-03 ENCOUNTER — Encounter: Payer: Self-pay | Admitting: Obstetrics

## 2014-10-10 ENCOUNTER — Ambulatory Visit: Payer: Medicaid Other | Admitting: Obstetrics

## 2014-10-21 ENCOUNTER — Ambulatory Visit (INDEPENDENT_AMBULATORY_CARE_PROVIDER_SITE_OTHER): Payer: Medicaid Other | Admitting: Obstetrics

## 2014-10-21 ENCOUNTER — Encounter: Payer: Self-pay | Admitting: Obstetrics

## 2014-10-21 DIAGNOSIS — R52 Pain, unspecified: Secondary | ICD-10-CM

## 2014-10-21 MED ORDER — OXYCODONE-ACETAMINOPHEN 5-325 MG PO TABS: 1.0000 | ORAL_TABLET | ORAL | Status: DC | PRN

## 2014-10-21 NOTE — Progress Notes (Signed)
Subjective:     Tonya Gray is a 33 y.o. female who presents for a postpartum visit. She is 8 weeks postpartum following a low cervical transverse Cesarean section. I have fully reviewed the prenatal and intrapartum course. The delivery was at 32 gestational weeks. Outcome: primary cesarean section, low transverse incision. Anesthesia: general. Postpartum course has been normak. Baby's course has been normal. Baby is feeding by breast. Bleeding no bleeding. Bowel function is normal. Bladder function is normal. Patient is sexually active. Contraception method is Depo-Provera injections. Postpartum depression screening: negative.  Tobacco, alcohol and substance abuse history reviewed.  Adult immunizations reviewed including TDAP, rubella and varicella.  The following portions of the patient's history were reviewed and updated as appropriate: allergies, current medications, past family history, past medical history, past social history, past surgical history and problem list.  Review of Systems A comprehensive review of systems was negative.   Objective:    BP 113/74 mmHg  Pulse 62  Temp(Src) 98.3 F (36.8 C)  Ht 5\' 3"  (1.6 m)  Wt 154 lb (69.854 kg)  BMI 27.29 kg/m2  Breastfeeding? Yes  General:  alert and no distress   Breasts:  inspection negative, no nipple discharge or bleeding, no masses or nodularity palpable  Lungs: clear to auscultation bilaterally  Heart:  regular rate and rhythm, S1, S2 normal, no murmur, click, rub or gallop  Abdomen: normal findings: soft, non-tender   Vulva:  normal  Vagina: normal vagina  Cervix:  no cervical motion tenderness  Corpus: normal size, contour, position, consistency, mobility, non-tender  Adnexa:  no mass, fullness, tenderness  Rectal Exam: Not performed.           Assessment:     Normal postpartum exam. Pap smear not done at today's visit.  Plan:    1. Contraception: Depo-Provera injections 2. Continue PNV's 3. Follow up in: several  months or as needed.  2hr GTT for h/o GDM/screening for DM q 3 yrs per ADA recommendations Preconception counseling provided Healthy lifestyle practices reviewed

## 2014-11-03 ENCOUNTER — Telehealth: Payer: Self-pay

## 2014-11-03 ENCOUNTER — Ambulatory Visit (INDEPENDENT_AMBULATORY_CARE_PROVIDER_SITE_OTHER): Payer: Medicaid Other | Admitting: *Deleted

## 2014-11-03 VITALS — BP 123/73 | HR 70 | Wt 155.0 lb

## 2014-11-03 DIAGNOSIS — Z3042 Encounter for surveillance of injectable contraceptive: Secondary | ICD-10-CM

## 2014-11-03 DIAGNOSIS — Z7689 Persons encountering health services in other specified circumstances: Secondary | ICD-10-CM

## 2014-11-03 DIAGNOSIS — Z0289 Encounter for other administrative examinations: Secondary | ICD-10-CM

## 2014-11-03 MED ORDER — MEDROXYPROGESTERONE ACETATE 150 MG/ML IM SUSP
150.0000 mg | INTRAMUSCULAR | Status: AC
  Administered 2014-11-03 – 2015-04-20 (×3): 150 mg via INTRAMUSCULAR

## 2014-11-03 NOTE — Telephone Encounter (Signed)
Patient has [facility] on her medicaid card - [facility] and takes medicaid patients, but will either have to get her card changed to them or have [facility] refer her

## 2014-11-03 NOTE — Progress Notes (Signed)
Pt is in office for Depo injection.  Pt is on time for injection.  Pt tolerated injection well.  Pt states that she would like referral to dermatologist for recent acne.  Pt made aware that referral would be ordered for her. Pt advised to return to office for next injection 01/19/15-02/03/15.  BP 123/73 mmHg  Pulse 70  Wt 155 lb (70.308 kg)   Administrations This Visit    medroxyPROGESTERone (DEPO-PROVERA) injection 150 mg    Administered Action Dose Route Administered By         11/03/2014 Given 150 mg Intramuscular Lanney GinsSuzanne D Obinna Ehresman, CMA

## 2014-11-04 ENCOUNTER — Telehealth: Payer: Self-pay

## 2014-11-04 NOTE — Telephone Encounter (Signed)
patient has appt with Dr Joanna PuffJames Szabo on 12/17 at 10:30am for [facility]t [facility]

## 2014-11-28 ENCOUNTER — Encounter: Payer: Self-pay | Admitting: *Deleted

## 2014-11-29 ENCOUNTER — Encounter: Payer: Self-pay | Admitting: Obstetrics & Gynecology

## 2015-01-19 ENCOUNTER — Telehealth: Payer: Self-pay | Admitting: *Deleted

## 2015-01-19 ENCOUNTER — Ambulatory Visit: Payer: Medicaid Other

## 2015-01-19 NOTE — Telephone Encounter (Signed)
Patient request call - patient states the pharmacy was out of her medication and didn't get it until this afternoon. Patient has been rescheduled.

## 2015-01-20 ENCOUNTER — Ambulatory Visit (INDEPENDENT_AMBULATORY_CARE_PROVIDER_SITE_OTHER): Payer: Medicaid Other | Admitting: *Deleted

## 2015-01-20 VITALS — BP 113/74 | HR 59 | Temp 97.7°F | Ht 62.0 in | Wt 153.0 lb

## 2015-01-20 DIAGNOSIS — Z3042 Encounter for surveillance of injectable contraceptive: Secondary | ICD-10-CM

## 2015-01-20 NOTE — Progress Notes (Signed)
Patient in office for a Depo Injection. Patient is on time for injection. Patient tolerated injection well.  Patient due for next injection Apr 14, 2015.  BP 113/74 mmHg  Pulse 59  Temp(Src) 97.7 F (36.5 C)  Ht 5\' 2"  (1.575 m)  Wt 153 lb (69.4 kg)  BMI 27.98 kg/m2  Breastfeeding? Yes

## 2015-04-18 ENCOUNTER — Ambulatory Visit: Payer: Medicaid Other | Admitting: Obstetrics

## 2015-04-20 ENCOUNTER — Ambulatory Visit (INDEPENDENT_AMBULATORY_CARE_PROVIDER_SITE_OTHER): Payer: Medicaid Other | Admitting: Obstetrics

## 2015-04-20 ENCOUNTER — Ambulatory Visit: Payer: Medicaid Other | Admitting: Obstetrics

## 2015-04-20 ENCOUNTER — Encounter: Payer: Self-pay | Admitting: Obstetrics

## 2015-04-20 VITALS — BP 111/74 | HR 59 | Wt 158.0 lb

## 2015-04-20 DIAGNOSIS — L989 Disorder of the skin and subcutaneous tissue, unspecified: Secondary | ICD-10-CM

## 2015-04-20 DIAGNOSIS — Z01419 Encounter for gynecological examination (general) (routine) without abnormal findings: Secondary | ICD-10-CM

## 2015-04-20 DIAGNOSIS — Z124 Encounter for screening for malignant neoplasm of cervix: Secondary | ICD-10-CM | POA: Diagnosis not present

## 2015-04-20 DIAGNOSIS — Z30014 Encounter for initial prescription of intrauterine contraceptive device: Secondary | ICD-10-CM | POA: Diagnosis not present

## 2015-04-20 NOTE — Progress Notes (Signed)
Pt is in office for Annual Exam with Depo injection.  Depo was given today.  Pt tolerated injecion well. Pt states that she plans to return for Mirena IUD before next Depo due.

## 2015-04-20 NOTE — Progress Notes (Signed)
Subjective:        Tonya Gray is a 34 y.o. female here for a routine exam.  Current complaints: vulva/perineal/perianal itching.  H/O Lichen Sclerosis, on Clobetasol cream.  Personal health questionnaire:  Is patient [name], have a family history of breast and/or ovarian cancer: no Is there a family history of uterine cancer diagnosed at age < 8450, gastrointestinal cancer, urinary tract cancer, family member who is a Personnel officerLynch syndrome-associated carrier: no Is the patient overweight and hypertensive, family history of diabetes, personal history of gestational diabetes, preeclampsia or PCOS: no Is patient over 255, have PCOS,  family history of premature CHD under age 36, diabetes, smoke, have hypertension or peripheral artery disease:  no At any time, has a partner hit, kicked or otherwise hurt or frightened you?: no Over the past 2 weeks, have you felt down, depressed or hopeless?: no Over the past 2 weeks, have you felt little interest or pleasure in doing things?:no   Gynecologic History No LMP recorded. Patient has had an injection. Contraception: Depo-Provera injections Last Pap: 2015. Results were: normal Last mammogram: n/a. Results were: n/a  Obstetric History OB History  Gravida Para Term Preterm AB SAB TAB Ectopic Multiple Living  5 5 4 1  0 0 0 0 0 4    # Outcome Date GA Lbr Len/2nd Weight Sex Delivery Anes PTL Lv  5 Preterm 07/19/14 9987w2d   F CS-LTranv Gen    4 Term 10/28/12 1943w5d 03:21 / 00:21 7 lb 2.6 oz (3.249 kg) F Vag-Spont None  Y     Comments: wnl  3 Term 2007 1237w0d 06:00 6 lb 12 oz (3.062 kg) F Vag-Spont None  Y  2 Term 2005 2937w0d 04:00 6 lb 15 oz (3.147 kg) M Vag-Spont   Y  1 Term 2003 3637w0d 08:00 5 lb 13 oz (2.637 kg) M Vag-Spont   Y      Past Medical History  Diagnosis Date  . No pertinent past medical history   . Medical history non-contributory     Past Surgical History  Procedure Laterality Date  . Wisdom tooth extraction    . No  past surgeries    . Cesarean section N/A 07/19/2014    Procedure: CESAREAN SECTION;  Surgeon: Brock Badharles A Liara Holm, MD;  Location: [facility];  Service: Obstetrics;  Laterality: N/A;     Current outpatient prescriptions:  .  Iron-FA-B Cmp-C-Biot-Probiotic (FUSION PLUS) CAPS, Take 1 capsule by mouth daily before breakfast., Disp: 30 capsule, Rfl: 5 .  medroxyPROGESTERone (DEPO-PROVERA) 150 MG/ML injection, Inject 1 mL (150 mg total) into the muscle every 3 (three) months., Disp: 1 mL, Rfl: 3 .  prenatal vitamin w/FE, FA (PRENATAL 1 + 1) 27-1 MG TABS tablet, Take 1 tablet by mouth daily at 12 noon., Disp: , Rfl:  .  clobetasol cream (TEMOVATE) 0.05 %, Apply 1 application topically 2 (two) times daily., Disp: 60 g, Rfl: 1 No Known Allergies  History  Substance Use Topics  . Smoking status: Never Smoker   . Smokeless tobacco: Never Used  . Alcohol Use: 0.0 oz/week    0 Standard drinks or equivalent per week     Comment: Occassionally     Family History  Problem Relation Age of Onset  . Anesthesia problems Neg Hx   . Sickle cell trait Daughter   . Other Daughter     shortened ulna  . Sickle cell trait Son   . Sickle cell trait Daughter   . Hypertension Mother   .  Diabetes Mother   . Cancer Brother   . Cancer Maternal Grandmother       Review of Systems  Constitutional: negative for fatigue and weight loss Respiratory: negative for cough and wheezing Cardiovascular: negative for chest pain, fatigue and palpitations Gastrointestinal: negative for abdominal pain and change in bowel habits Musculoskeletal:negative for myalgias Neurological: negative for gait problems and tremors Behavioral/Psych: negative for abusive relationship, depression Endocrine: negative for temperature intolerance   Genitourinary: positive for vulva/perineal/perianal itching Integument/breast: negative for breast lump, breast tenderness, nipple discharge and skin lesion(s)    Objective:       BP 111/74 mmHg   Pulse 59  Wt 158 lb (71.668 kg) General:   alert  Skin:   no rash or abnormalities  Lungs:   clear to auscultation bilaterally  Heart:   regular rate and rhythm, S1, S2 normal, no murmur, click, rub or gallop  Breasts:   normal without suspicious masses, skin or nipple changes or axillary nodes  Abdomen:  normal findings: no organomegaly, soft, non-tender and no hernia  Pelvis:  External genitalia: whitish skin in perineal and vulva areas Urinary system: urethral meatus normal and bladder without fullness, nontender Vaginal: normal without tenderness, induration or masses Cervix: normal appearance Adnexa: normal bimanual exam Uterus: anteverted and non-tender, normal size   Lab Review Urine pregnancy test Labs reviewed yes Radiologic studies reviewed no    Assessment:    Healthy female exam.    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  Contraceptive management   Plan:    Education reviewed: calcium supplements, low fat, low cholesterol diet, self breast exams, weight bearing exercise and management of Lichen Sclerosis. Contraception: Depo-Provera injections. Follow up in: 3 months.   Meds ordered this encounter  Medications  . prenatal vitamin w/FE, FA (PRENATAL 1 + 1) 27-1 MG TABS tablet    Sig: Take 1 tablet by mouth daily at 12 noon.   Orders Placed This Encounter  Procedures  . SureSwab, Vaginosis/Vaginitis Plus

## 2015-04-21 ENCOUNTER — Encounter: Payer: Self-pay | Admitting: Obstetrics

## 2015-04-24 ENCOUNTER — Other Ambulatory Visit: Payer: Self-pay | Admitting: Obstetrics

## 2015-04-24 DIAGNOSIS — B9689 Other specified bacterial agents as the cause of diseases classified elsewhere: Secondary | ICD-10-CM

## 2015-04-24 DIAGNOSIS — B3731 Acute candidiasis of vulva and vagina: Secondary | ICD-10-CM

## 2015-04-24 DIAGNOSIS — B373 Candidiasis of vulva and vagina: Secondary | ICD-10-CM

## 2015-04-24 DIAGNOSIS — N760 Acute vaginitis: Principal | ICD-10-CM

## 2015-04-24 LAB — SURESWAB, VAGINOSIS/VAGINITIS PLUS
ATOPOBIUM VAGINAE: NOT DETECTED Log (cells/mL)
C. TROPICALIS, DNA: NOT DETECTED
C. albicans, DNA: DETECTED — AB
C. glabrata, DNA: NOT DETECTED
C. parapsilosis, DNA: NOT DETECTED
C. trachomatis RNA, TMA: NOT DETECTED
Gardnerella vaginalis: >8 Log (cells/mL)
LACTOBACILLUS SPECIES: NOT DETECTED Log (cells/mL)
MEGASPHAERA SPECIES: NOT DETECTED Log (cells/mL)
N. gonorrhoeae RNA, TMA: NOT DETECTED
T. vaginalis RNA, QL TMA: NOT DETECTED

## 2015-04-24 LAB — PAP IG AND HPV HIGH-RISK: HPV DNA High Risk: NOT DETECTED

## 2015-04-24 MED ORDER — FLUCONAZOLE 150 MG PO TABS: 150.0000 mg | ORAL_TABLET | Freq: Once | ORAL | Status: DC

## 2015-04-24 MED ORDER — TINIDAZOLE 500 MG PO TABS: 1000.0000 mg | ORAL_TABLET | Freq: Every day | ORAL | Status: DC

## 2015-05-24 ENCOUNTER — Encounter: Payer: Self-pay | Admitting: Obstetrics

## 2015-05-24 ENCOUNTER — Ambulatory Visit (INDEPENDENT_AMBULATORY_CARE_PROVIDER_SITE_OTHER): Payer: Medicaid Other | Admitting: Obstetrics

## 2015-05-24 VITALS — BP 126/84 | HR 71 | Temp 97.0°F | Ht 63.0 in | Wt 158.0 lb

## 2015-05-24 DIAGNOSIS — Z01812 Encounter for preprocedural laboratory examination: Secondary | ICD-10-CM

## 2015-05-24 DIAGNOSIS — Z30430 Encounter for insertion of intrauterine contraceptive device: Secondary | ICD-10-CM

## 2015-05-24 DIAGNOSIS — Z113 Encounter for screening for infections with a predominantly sexual mode of transmission: Secondary | ICD-10-CM

## 2015-05-24 DIAGNOSIS — Z30014 Encounter for initial prescription of intrauterine contraceptive device: Secondary | ICD-10-CM | POA: Diagnosis not present

## 2015-05-24 LAB — POCT URINE PREGNANCY: PREG TEST UR: NEGATIVE

## 2015-05-24 NOTE — Progress Notes (Signed)
IUD Insertion Procedure Note  Pre-operative Diagnosis: Desire contraception  Post-operative Diagnosis: same  Indications: contraception  Procedure Details  Urine pregnancy test was done in office and result was negative.  The risks (including infection, bleeding, pain, and uterine perforation) and benefits of the procedure were explained to the patient and Written informed consent was obtained.    Cervix cleansed with Betadine. Uterus sounded to 8 cm. IUD inserted without difficulty. String visible and trimmed. Patient tolerated procedure well.  IUD Information: Mirena, Lot # [id number], Expiration date 11 / 2018.  Condition: Stable  Complications: None  Plan:  The patient was advised to call for any fever or for prolonged or severe pain or bleeding. She was advised to use NSAID as needed for mild to moderate pain.   Attending Physician Documentation: I was present for or participated in the entire procedure, including opening and closing. 

## 2015-05-24 NOTE — Addendum Note (Signed)
Addended by: Marya Landry D on: 05/24/2015 10:42 AM   Modules accepted: Orders

## 2015-05-28 ENCOUNTER — Other Ambulatory Visit: Payer: Self-pay | Admitting: Obstetrics

## 2015-05-28 DIAGNOSIS — B9689 Other specified bacterial agents as the cause of diseases classified elsewhere: Secondary | ICD-10-CM

## 2015-05-28 DIAGNOSIS — L900 Lichen sclerosus et atrophicus: Secondary | ICD-10-CM

## 2015-05-28 DIAGNOSIS — N760 Acute vaginitis: Secondary | ICD-10-CM

## 2015-05-28 LAB — SURESWAB, VAGINOSIS/VAGINITIS PLUS
Atopobium vaginae: NOT DETECTED Log (cells/mL)
C. albicans, DNA: NOT DETECTED
C. glabrata, DNA: NOT DETECTED
C. parapsilosis, DNA: NOT DETECTED
C. trachomatis RNA, TMA: NOT DETECTED
C. tropicalis, DNA: NOT DETECTED
GARDNERELLA VAGINALIS: 7.9 Log (cells/mL)
LACTOBACILLUS SPECIES: NOT DETECTED Log (cells/mL)
MEGASPHAERA SPECIES: NOT DETECTED Log (cells/mL)
N. gonorrhoeae RNA, TMA: NOT DETECTED
T. vaginalis RNA, QL TMA: NOT DETECTED

## 2015-05-28 MED ORDER — TINIDAZOLE 500 MG PO TABS: 1000.0000 mg | ORAL_TABLET | Freq: Every day | ORAL | Status: DC

## 2015-07-05 ENCOUNTER — Encounter: Payer: Self-pay | Admitting: Obstetrics

## 2015-07-05 ENCOUNTER — Ambulatory Visit (INDEPENDENT_AMBULATORY_CARE_PROVIDER_SITE_OTHER): Payer: Medicaid Other | Admitting: Obstetrics

## 2015-07-05 VITALS — BP 120/78 | HR 68 | Temp 98.3°F | Wt 159.0 lb

## 2015-07-05 DIAGNOSIS — Z30431 Encounter for routine checking of intrauterine contraceptive device: Secondary | ICD-10-CM

## 2015-07-05 NOTE — Progress Notes (Signed)
Subjective:    Tonya Gray is a 34 y.o. female who presents for 6 week follow up after IUD insertion. The patient has no complaints today. The patient is sexually active. Pertinent past medical history: none.  The information documented in the HPI was reviewed and verified.  Menstrual History: OB History    Gravida Para Term Preterm AB TAB SAB Ectopic Multiple Living   0 0 0 0 0 4       No LMP recorded. Patient is not currently having periods (Reason: IUD).   Patient Active Problem List   Diagnosis Date Noted  . S/P cesarean section 07/19/2014  . Preterm premature rupture of membranes (PPROM) with unknown onset of labor 06/27/2014  . Supervision of other normal pregnancy 05/27/2014  . 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 Lichen sclerosus 03/17/2014  . Sickle cell trait 05/26/2012   Past Medical History  Diagnosis Date  . No pertinent past medical history   . Medical history non-contributory     Past Surgical History  Procedure Laterality Date  . Wisdom tooth extraction    . No past surgeries    . Cesarean section N/A 07/19/2014    Procedure: CESAREAN SECTION;  Surgeon: [name], MD;  Location: [facility];  Service: Obstetrics;  Laterality: N/A;     Current outpatient prescriptions:  .  clobetasol cream (TEMOVATE) 0.05 %, Apply 1 application topically 2 (two) times daily., Disp: 60 g, Rfl: 1 .  prenatal vitamin w/FE, FA (PRENATAL 1 + 1) 27-1 MG TABS tablet, Take 1 tablet by mouth daily at 12 noon., Disp: , Rfl:  .  fluconazole (DIFLUCAN) 150 MG tablet, Take 1 tablet (150 mg total) by mouth once. (Patient not taking: Reported on 05/24/2015), Disp: 1 tablet, Rfl: 2 .  Iron-FA-B Cmp-C-Biot-Probiotic (FUSION PLUS) CAPS, Take 1 capsule by mouth daily before breakfast. (Patient not taking: Reported on 07/05/2015), Disp: 30 capsule, Rfl: 5 .  medroxyPROGESTERone (DEPO-PROVERA) 150 MG/ML injection, Inject 1 mL (150 mg total) into the muscle every 3 (three) months. (Patient not  taking: Reported on 05/24/2015), Disp: 1 mL, Rfl: 3 .  tinidazole (TINDAMAX) 500 MG tablet, Take 2 tablets (1,000 mg total) by mouth daily with breakfast. (Patient not taking: Reported on 07/05/2015), Disp: 10 tablet, Rfl: 2 No Known Allergies  History  Substance Use Topics  . Smoking status: Never Smoker   . Smokeless tobacco: Never Used  . Alcohol Use: 0.0 oz/week    0 Standard drinks or equivalent per week     Comment: Occassionally     Family History  Problem Relation Age of Onset  . Anesthesia problems Neg Hx   . Sickle cell trait Daughter   . Other Daughter     shortened ulna  . Sickle cell trait Son   . Sickle cell trait Daughter   . Hypertension Mother   . Diabetes Mother   . Cancer Brother   . Cancer Maternal Grandmother        Review of Systems Constitutional: negative for weight loss Genitourinary:negative for abnormal menstrual periods and vaginal discharge   Objective:   BP 120/78 mmHg  Pulse 68  Temp(Src) 98.3 F (36.8 C)  Wt 159 lb (72.122 kg)  Breastfeeding? Yes   General:   alert  Skin:   no rash or abnormalities  Lungs:   clear to auscultation bilaterally  Heart:   regular rate and rhythm, S1, S2 normal, no murmur, click, rub or gallop  Breasts:  normal without suspicious masses, skin or nipple changes or axillary nodes  Abdomen:  normal findings: no organomegaly, soft, non-tender and no hernia  Pelvis:  External genitalia: normal general appearance Urinary system: urethral meatus normal and bladder without fullness, nontender Vaginal: normal without tenderness, induration or masses Cervix: normal appearance Adnexa: normal bimanual exam Uterus: anteverted and non-tender, normal size   Lab Review Urine pregnancy test Labs reviewed yes Radiologic studies reviewed no    Assessment:    34 y.o., continuing IUD, no contraindications.   Plan:    All questions answered. Follow up as needed.    No orders of the defined types were placed in  this encounter.   No orders of the defined types were placed in this encounter.

## 2015-07-07 ENCOUNTER — Telehealth: Payer: Self-pay | Admitting: *Deleted

## 2015-07-07 DIAGNOSIS — B9689 Other specified bacterial agents as the cause of diseases classified elsewhere: Secondary | ICD-10-CM

## 2015-07-07 DIAGNOSIS — N760 Acute vaginitis: Principal | ICD-10-CM

## 2015-07-07 MED ORDER — CLINDAMYCIN HCL 300 MG PO CAPS: 300.0000 mg | ORAL_CAPSULE | Freq: Two times a day (BID) | ORAL | Status: DC

## 2015-07-07 NOTE — Telephone Encounter (Signed)
Patient contacted the office stating she had picked up the prescription for Tindamax. Patient is concerned about taking it while breast feeding. Patient wants to know if it is okay to take. Per Dr. Clearance Coots, prescription for Clindamycin sent to the pharmacy. Left message on voicemail to notify the patient.

## 2016-04-23 ENCOUNTER — Ambulatory Visit: Payer: Medicaid Other | Admitting: Obstetrics

## 2016-10-02 ENCOUNTER — Ambulatory Visit: Payer: Medicaid Other | Admitting: Obstetrics

## 2016-12-25 ENCOUNTER — Other Ambulatory Visit (HOSPITAL_COMMUNITY)
Admission: RE | Admit: 2016-12-25 | Discharge: 2016-12-25 | Disposition: A | Discharge status: Home / Self Care | Payer: Medicaid Other | Source: Ambulatory Visit | Attending: Obstetrics | Admitting: Obstetrics

## 2016-12-25 ENCOUNTER — Ambulatory Visit (INDEPENDENT_AMBULATORY_CARE_PROVIDER_SITE_OTHER): Payer: Medicaid Other | Admitting: Obstetrics

## 2016-12-25 ENCOUNTER — Encounter: Payer: Self-pay | Admitting: Obstetrics

## 2016-12-25 VITALS — BP 124/84 | HR 64 | Wt 176.4 lb

## 2016-12-25 DIAGNOSIS — Z1151 Encounter for screening for human papillomavirus (HPV): Secondary | ICD-10-CM | POA: Diagnosis present

## 2016-12-25 DIAGNOSIS — Z113 Encounter for screening for infections with a predominantly sexual mode of transmission: Secondary | ICD-10-CM

## 2016-12-25 DIAGNOSIS — Z01419 Encounter for gynecological examination (general) (routine) without abnormal findings: Secondary | ICD-10-CM

## 2016-12-25 DIAGNOSIS — Z0000 Encounter for general adult medical examination without abnormal findings: Secondary | ICD-10-CM | POA: Diagnosis not present

## 2016-12-25 DIAGNOSIS — Z30011 Encounter for initial prescription of contraceptive pills: Secondary | ICD-10-CM

## 2016-12-25 DIAGNOSIS — Z30432 Encounter for removal of intrauterine contraceptive device: Secondary | ICD-10-CM

## 2016-12-25 MED ORDER — NORGESTIMATE-ETH ESTRADIOL 0.25-35 MG-MCG PO TABS: 1.0000 | ORAL_TABLET | Freq: Every day | ORAL | 11 refills | Status: DC

## 2016-12-25 NOTE — Progress Notes (Signed)
Patient wants to remove her Mirena- she is having problems with depression- weight gain, cramping. Acne is worse also.

## 2016-12-25 NOTE — Progress Notes (Signed)
Subjective:        Tonya Gray is a 36 y.o. female here for a routine exam.  Current complaints:  Wants IUD removed because of weight gain, acne and mood swings.  Would like to start OCP's.  Personal health questionnaire:  Is patient [name], have a family history of breast and/or ovarian cancer: no Is there a family history of uterine cancer diagnosed at age < 7950, gastrointestinal cancer, urinary tract cancer, family member who is a Personnel officerLynch syndrome-associated carrier: no Is the patient overweight and hypertensive, family history of diabetes, personal history of gestational diabetes, preeclampsia or PCOS: no Is patient over 1455, have PCOS,  family history of premature CHD under age 38, diabetes, smoke, have hypertension or peripheral artery disease:  no At any time, has a partner hit, kicked or otherwise hurt or frightened you?: no Over the past 2 weeks, have you felt down, depressed or hopeless?: no Over the past 2 weeks, have you felt little interest or pleasure in doing things?:no   Gynecologic History No LMP recorded. Patient is not currently having periods (Reason: IUD). Contraception: IUD Last Pap: 2015. Results were: normal Last mammogram: n/a. Results were: n/a  Obstetric History OB History  Gravida Para Term Preterm AB Living  5 5 4 1  0 4  SAB TAB Ectopic Multiple Live Births  0 0 0 0 4    # Outcome Date GA Lbr Len/2nd Weight Sex Delivery Anes PTL Lv  5 Preterm 07/19/14 6919w2d   F CS-LTranv Gen    4 Term 10/28/12 5160w5d 03:21 / 00:21 7 lb 2.6 oz (3.249 kg) F Vag-Spont None  LIV     Birth Comments: wnl  3 Term 2007 6250w0d 06:00 6 lb 12 oz (3.062 kg) F Vag-Spont None  LIV  2 Term 2005 2450w0d 04:00 6 lb 15 oz (3.147 kg) M Vag-Spont   LIV  1 Term 2003 3750w0d 08:00 5 lb 13 oz (2.637 kg) M Vag-Spont   LIV      Past Medical History:  Diagnosis Date  . Medical history non-contributory   . No pertinent past medical history     Past Surgical History:  Procedure  Laterality Date  . CESAREAN SECTION N/A 07/19/2014   Procedure: CESAREAN SECTION;  Surgeon: Brock Badharles A Ger Nicks, MD;  Location: [facility];  Service: Obstetrics;  Laterality: N/A;  . NO PAST SURGERIES    . WISDOM TOOTH EXTRACTION       Current Outpatient Prescriptions:  .  clobetasol cream (TEMOVATE) 0.05 %, Apply 1 application topically 2 (two) times daily., Disp: 60 g, Rfl: 1 No Known Allergies  Social History  Substance Use Topics  . Smoking status: Never Smoker  . Smokeless tobacco: Never Used  . Alcohol use 0.0 oz/week     Comment: Occassionally     Family History  Problem Relation Age of Onset  . Hypertension Mother   . Diabetes Mother   . Cancer Brother   . Sickle cell trait Daughter   . Other Daughter     shortened ulna  . Sickle cell trait Son   . Sickle cell trait Daughter   . Cancer Maternal Grandmother   . Anesthesia problems Neg Hx       Review of Systems  Constitutional: negative for fatigue and weight loss Respiratory: negative for cough and wheezing Cardiovascular: negative for chest pain, fatigue and palpitations Gastrointestinal: negative for abdominal pain and change in bowel habits Musculoskeletal:negative for myalgias Neurological: negative for gait problems and  tremors Behavioral/Psych: negative for abusive relationship, depression Endocrine: negative for temperature intolerance    Genitourinary:negative for abnormal menstrual periods, genital lesions, hot flashes, sexual problems and vaginal discharge Integument/breast: negative for breast lump, breast tenderness, nipple discharge and skin lesion(s)    Objective:       BP 124/84   Pulse 64   Wt 176 lb 6.4 oz (80 kg)   Breastfeeding? No   BMI 31.25 kg/m  General:   alert  Skin:   no rash or abnormalities  Lungs:   clear to auscultation bilaterally  Heart:   regular rate and rhythm, S1, S2 normal, no murmur, click, rub or gallop  Breasts:   normal without suspicious masses, skin or nipple  changes or axillary nodes  Abdomen:  normal findings: no organomegaly, soft, non-tender and no hernia  Pelvis:  External genitalia: normal general appearance Urinary system: urethral meatus normal and bladder without fullness, nontender Vaginal: normal without tenderness, induration or masses Cervix: normal appearance Adnexa: normal bimanual exam Uterus: anteverted and non-tender, normal size   Lab Review Urine pregnancy test Labs reviewed yes Radiologic studies reviewed yes  50% of 20 min visit spent on counseling and coordination of care.    Assessment:    Healthy female exam.    IUD Removal   Plan:    Mirena IUD removed  [facility]  Education reviewed: calcium supplements, depression evaluation, low fat, low cholesterol diet, safe sex/STD prevention, self breast exams and weight bearing exercise. Contraception: OCP (estrogen/progesterone). Follow up in: 1 year.   No orders of the defined types were placed in this encounter.  No orders of the defined types were placed in this encounter.    Patient ID: Tonya Gray, female   DOB: [birth year], 36 y.o.   MRN: [id number]

## 2016-12-26 LAB — CERVICOVAGINAL ANCILLARY ONLY
BACTERIAL VAGINITIS: POSITIVE — AB
CHLAMYDIA, DNA PROBE: NEGATIVE
Candida vaginitis: POSITIVE — AB
NEISSERIA GONORRHEA: NEGATIVE
Trichomonas: NEGATIVE

## 2016-12-26 LAB — HEPATITIS B SURFACE ANTIGEN: Hepatitis B Surface Ag: NEGATIVE

## 2016-12-26 LAB — HEPATITIS C ANTIBODY

## 2016-12-26 LAB — CYTOLOGY - PAP
Diagnosis: NEGATIVE
HPV (WINDOPATH): NOT DETECTED

## 2016-12-26 LAB — RPR: RPR Ser Ql: NONREACTIVE

## 2016-12-26 LAB — HIV ANTIBODY (ROUTINE TESTING W REFLEX): HIV SCREEN 4TH GENERATION: NONREACTIVE

## 2016-12-27 ENCOUNTER — Other Ambulatory Visit: Payer: Self-pay | Admitting: Obstetrics

## 2016-12-27 DIAGNOSIS — B9689 Other specified bacterial agents as the cause of diseases classified elsewhere: Secondary | ICD-10-CM

## 2016-12-27 DIAGNOSIS — N760 Acute vaginitis: Principal | ICD-10-CM

## 2016-12-27 DIAGNOSIS — B373 Candidiasis of vulva and vagina: Secondary | ICD-10-CM

## 2016-12-27 DIAGNOSIS — B3731 Acute candidiasis of vulva and vagina: Secondary | ICD-10-CM

## 2016-12-27 MED ORDER — FLUCONAZOLE 150 MG PO TABS: 150.0000 mg | ORAL_TABLET | Freq: Once | ORAL | 2 refills | Status: AC

## 2016-12-27 MED ORDER — TINIDAZOLE 500 MG PO TABS: 1000.0000 mg | ORAL_TABLET | Freq: Every day | ORAL | 2 refills | Status: AC

## 2016-12-31 ENCOUNTER — Telehealth: Payer: Self-pay | Admitting: *Deleted

## 2016-12-31 NOTE — Telephone Encounter (Signed)
PA for Tinidazole was submitted and approved. Pharmacy made aware.

## 2017-12-06 ENCOUNTER — Other Ambulatory Visit: Payer: Self-pay | Admitting: Obstetrics

## 2017-12-06 DIAGNOSIS — Z01419 Encounter for gynecological examination (general) (routine) without abnormal findings: Secondary | ICD-10-CM

## 2018-10-16 ENCOUNTER — Encounter: Payer: Self-pay | Admitting: Obstetrics

## 2018-10-16 ENCOUNTER — Ambulatory Visit (INDEPENDENT_AMBULATORY_CARE_PROVIDER_SITE_OTHER): Payer: BLUE CROSS/BLUE SHIELD | Admitting: Obstetrics

## 2018-10-16 ENCOUNTER — Other Ambulatory Visit: Payer: Self-pay

## 2018-10-16 VITALS — BP 127/84 | HR 65 | Ht 63.0 in | Wt 162.9 lb

## 2018-10-16 DIAGNOSIS — Z113 Encounter for screening for infections with a predominantly sexual mode of transmission: Secondary | ICD-10-CM

## 2018-10-16 DIAGNOSIS — L900 Lichen sclerosus et atrophicus: Secondary | ICD-10-CM

## 2018-10-16 DIAGNOSIS — N898 Other specified noninflammatory disorders of vagina: Secondary | ICD-10-CM

## 2018-10-16 DIAGNOSIS — Z1151 Encounter for screening for human papillomavirus (HPV): Secondary | ICD-10-CM | POA: Diagnosis not present

## 2018-10-16 DIAGNOSIS — N760 Acute vaginitis: Secondary | ICD-10-CM

## 2018-10-16 DIAGNOSIS — B373 Candidiasis of vulva and vagina: Secondary | ICD-10-CM | POA: Diagnosis not present

## 2018-10-16 DIAGNOSIS — N946 Dysmenorrhea, unspecified: Secondary | ICD-10-CM

## 2018-10-16 DIAGNOSIS — B9689 Other specified bacterial agents as the cause of diseases classified elsewhere: Secondary | ICD-10-CM

## 2018-10-16 DIAGNOSIS — Z124 Encounter for screening for malignant neoplasm of cervix: Secondary | ICD-10-CM | POA: Diagnosis not present

## 2018-10-16 DIAGNOSIS — Z3041 Encounter for surveillance of contraceptive pills: Secondary | ICD-10-CM

## 2018-10-16 DIAGNOSIS — Z01419 Encounter for gynecological examination (general) (routine) without abnormal findings: Secondary | ICD-10-CM | POA: Diagnosis not present

## 2018-10-16 MED ORDER — CLOBETASOL PROPIONATE 0.05 % EX CREA: 1.0000 "application " | TOPICAL_CREAM | Freq: Two times a day (BID) | CUTANEOUS | 1 refills | Status: DC

## 2018-10-16 MED ORDER — IBUPROFEN 800 MG PO TABS: 800.0000 mg | ORAL_TABLET | Freq: Three times a day (TID) | ORAL | 5 refills | Status: DC | PRN

## 2018-10-16 MED ORDER — NORGESTIMATE-ETH ESTRADIOL 0.25-35 MG-MCG PO TABS: 1.0000 | ORAL_TABLET | Freq: Every day | ORAL | 11 refills | Status: DC

## 2018-10-16 NOTE — Progress Notes (Signed)
Presents for AEX/PAP/STD check. C/o Mole tag on inside of R labia, hurts 6/10 intermittently.  On OCP, needs refills also for clobetasol cream.

## 2018-10-16 NOTE — Progress Notes (Signed)
Subjective:        Tonya Gray is a 37 y.o. female here for a routine exam.  Current complaints: None   Personal health questionnaire:  Is patient Tonya Gray, have a family history of breast and/or ovarian cancer: no Is there a family history of uterine cancer diagnosed at age < 18, gastrointestinal cancer, urinary tract cancer, family member who is a Personnel officer syndrome-associated carrier: no Is the patient overweight and hypertensive, family history of diabetes, personal history of gestational diabetes, preeclampsia or PCOS: no Is patient over 14, have PCOS,  family history of premature CHD under age 99, diabetes, smoke, have hypertension or peripheral artery disease:  no At any time, has a partner hit, kicked or otherwise hurt or frightened you?: no Over the past 2 weeks, have you felt down, depressed or hopeless?: no Over the past 2 weeks, have you felt little interest or pleasure in doing things?:no   Gynecologic History No LMP recorded. (Menstrual status: IUD). Contraception: OCP (estrogen/progesterone) Last Pap: 2018. Results were: normal Last mammogram: n/a. Results were: n/a  Obstetric History OB History  Gravida Para Term Preterm AB Living  5 5 4 1  0 4  SAB TAB Ectopic Multiple Live Births  0 0 0 0 4    # Outcome Date GA Lbr Len/2nd Weight Sex Delivery Anes PTL Lv  5 Preterm 07/19/14 [redacted]w[redacted]d   F CS-LTranv Gen    4 Term 10/28/12 [redacted]w[redacted]d 03:21 / 00:21 7 lb 2.6 oz (3.249 kg) F Vag-Spont None  LIV     Birth Comments: wnl  3 Term 2007 [redacted]w[redacted]d 06:00 6 lb 12 oz (3.062 kg) F Vag-Spont None  LIV  2 Term 2005 [redacted]w[redacted]d 04:00 6 lb 15 oz (3.147 kg) M Vag-Spont   LIV  1 Term 2003 [redacted]w[redacted]d 08:00 5 lb 13 oz (2.637 kg) M Vag-Spont   LIV    Past Medical History:  Diagnosis Date  . Medical history non-contributory   . No pertinent past medical history     Past Surgical History:  Procedure Laterality Date  . CESAREAN SECTION N/A 07/19/2014   Procedure: CESAREAN SECTION;  Surgeon:  [name], MD;  Location: [facility];  Service: Obstetrics;  Laterality: N/A;  . NO PAST SURGERIES    . WISDOM TOOTH EXTRACTION       Current Outpatient Medications:  .  clobetasol cream (TEMOVATE) 0.05 %, Apply 1 application topically 2 (two) times daily., Disp: 60 g, Rfl: 1 .  ibuprofen (ADVIL,MOTRIN) 800 MG tablet, Take 1 tablet (800 mg total) by mouth every 8 (eight) hours as needed., Disp: 30 tablet, Rfl: 5 .  norgestimate-ethinyl estradiol (SPRINTEC 28) 0.25-35 MG-MCG tablet, Take 1 tablet by mouth daily., Disp: 28 tablet, Rfl: 11 .  tinidazole (TINDAMAX) 500 MG tablet, Take 2 tablets (1,000 mg total) by mouth daily with breakfast., Disp: 10 tablet, Rfl: 2 No Known Allergies  Social History   Tobacco Use  . Smoking status: Never Smoker  . Smokeless tobacco: Never Used  Substance Use Topics  . Alcohol use: Yes    Alcohol/week: 0.0 standard drinks    Comment: Occassionally     Family History  Problem Relation Age of Onset  . Hypertension Mother   . Diabetes Mother   . Cancer Brother   . Sickle cell trait Daughter   . Other Daughter        shortened ulna  . Sickle cell trait Son   . Sickle cell trait Daughter   . Cancer  Maternal Grandmother   . Anesthesia problems Neg Hx       Review of Systems  Constitutional: negative for fatigue and weight loss Respiratory: negative for cough and wheezing Cardiovascular: negative for chest pain, fatigue and palpitations Gastrointestinal: negative for abdominal pain and change in bowel habits Musculoskeletal:negative for myalgias Neurological: negative for gait problems and tremors Behavioral/Psych: negative for abusive relationship, depression Endocrine: negative for temperature intolerance    Genitourinary:negative for abnormal menstrual periods, genital lesions, hot flashes, sexual problems and vaginal discharge Integument/breast: negative for breast lump, breast tenderness, nipple discharge and skin lesion(s)     Objective:       BP 127/84   Pulse 65   Ht 5\' 3"  (1.6 m)   Wt 162 lb 14.4 oz (73.9 kg)   BMI 28.86 kg/m  General:   alert  Skin:   no rash or abnormalities  Lungs:   clear to auscultation bilaterally  Heart:   regular rate and rhythm, S1, S2 normal, no murmur, click, rub or gallop  Breasts:   normal without suspicious masses, skin or nipple changes or axillary nodes  Abdomen:  normal findings: no organomegaly, soft, non-tender and no hernia  Pelvis:  External genitalia: normal general appearance Urinary system: urethral meatus normal and bladder without fullness, nontender Vaginal: normal without tenderness, induration or masses Cervix: normal appearance Adnexa: normal bimanual exam Uterus: anteverted and non-tender, normal size   Lab Review Urine pregnancy test Labs reviewed yes Radiologic studies reviewed no  50% of 20 min visit spent on counseling and coordination of care.   Assessment:     1. Encounter for routine gynecological examination with Papanicolaou smear of cervix Rx: - Cytology - PAP( [place])  2. Dysmenorrhea Rx: - ibuprofen (ADVIL,MOTRIN) 800 MG tablet; Take 1 tablet (800 mg total) by mouth every 8 (eight) hours as needed.  Dispense: 30 tablet; Refill: 5  3. Vaginal discharge Rx: - Cervicovaginal ancillary only( [name])  4. Encounter for surveillance of contraceptive pills Rx: - norgestimate-ethinyl estradiol (SPRINTEC 28) 0.25-35 MG-MCG tablet; Take 1 tablet by mouth daily.  Dispense: 28 tablet; Refill: 11  5. Lichen sclerosus Rx: - clobetasol cream (TEMOVATE) 0.05 %; Apply 1 application topically 2 (two) times daily.  Dispense: 60 g; Refill: 1   Plan:    Education reviewed: calcium supplements, depression evaluation, low fat, low cholesterol diet, safe sex/STD prevention, self breast exams and weight bearing exercise. Contraception: OCP (estrogen/progesterone). Follow up in: 1 year.   Meds ordered this encounter  Medications  .  ibuprofen (ADVIL,MOTRIN) 800 MG tablet    Sig: Take 1 tablet (800 mg total) by mouth every 8 (eight) hours as needed.    Dispense:  30 tablet    Refill:  5  . clobetasol cream (TEMOVATE) 0.05 %    Sig: Apply 1 application topically 2 (two) times daily.    Dispense:  60 g    Refill:  1  . norgestimate-ethinyl estradiol (SPRINTEC 28) 0.25-35 MG-MCG tablet    Sig: Take 1 tablet by mouth daily.    Dispense:  28 tablet    Refill:  11    Please consider 90 day supplies to promote better adherence   No orders of the defined types were placed in this encounter.   Brock BadHARLES A. HARPER MD 10-16-2018

## 2018-10-20 LAB — CERVICOVAGINAL ANCILLARY ONLY
Bacterial vaginitis: POSITIVE — AB
Candida vaginitis: POSITIVE — AB
Chlamydia: NEGATIVE
Neisseria Gonorrhea: NEGATIVE
TRICH (WINDOWPATH): NEGATIVE

## 2018-10-21 ENCOUNTER — Other Ambulatory Visit: Payer: Self-pay | Admitting: Obstetrics

## 2018-10-21 DIAGNOSIS — B3731 Acute candidiasis of vulva and vagina: Secondary | ICD-10-CM

## 2018-10-21 DIAGNOSIS — B373 Candidiasis of vulva and vagina: Secondary | ICD-10-CM

## 2018-10-21 LAB — CYTOLOGY - PAP
DIAGNOSIS: NEGATIVE
HPV: NOT DETECTED

## 2018-10-21 MED ORDER — FLUCONAZOLE 150 MG PO TABS: 150.0000 mg | ORAL_TABLET | Freq: Once | ORAL | 2 refills | Status: AC

## 2019-10-18 ENCOUNTER — Other Ambulatory Visit: Payer: Self-pay | Admitting: Obstetrics

## 2019-10-18 ENCOUNTER — Telehealth: Payer: Self-pay

## 2019-10-18 ENCOUNTER — Other Ambulatory Visit: Payer: Self-pay

## 2019-10-18 DIAGNOSIS — Z3041 Encounter for surveillance of contraceptive pills: Secondary | ICD-10-CM

## 2019-10-18 MED ORDER — NORGESTIMATE-ETH ESTRADIOL 0.25-35 MG-MCG PO TABS: 1.0000 | ORAL_TABLET | Freq: Every day | ORAL | 3 refills | Status: DC

## 2019-10-18 NOTE — Telephone Encounter (Signed)
Attempted to contact patient to make aware BCP Rx was sent to pharmacy as requested. Pt will need AEX for additional refills.

## 2020-03-12 ENCOUNTER — Other Ambulatory Visit: Payer: Self-pay | Admitting: Obstetrics

## 2020-03-12 DIAGNOSIS — Z3041 Encounter for surveillance of contraceptive pills: Secondary | ICD-10-CM

## 2020-06-26 ENCOUNTER — Telehealth: Payer: Self-pay | Admitting: *Deleted

## 2020-06-26 NOTE — Telephone Encounter (Signed)
Pt called to office for refill on Clobetasol cream  Pt would like refill to be sent to [facility] on [address]  Pt has not been seen since 2019.      Please refill if approved.

## 2020-06-27 ENCOUNTER — Other Ambulatory Visit: Payer: Self-pay | Admitting: Obstetrics

## 2020-06-27 DIAGNOSIS — L900 Lichen sclerosus et atrophicus: Secondary | ICD-10-CM

## 2020-06-27 MED ORDER — CLOBETASOL PROPIONATE 0.05 % EX CREA: 1.0000 "application " | TOPICAL_CREAM | Freq: Two times a day (BID) | CUTANEOUS | 0 refills | Status: DC

## 2020-06-27 NOTE — Telephone Encounter (Signed)
Clobetasol Rx

## 2021-05-01 ENCOUNTER — Other Ambulatory Visit: Payer: Self-pay | Admitting: Obstetrics

## 2021-05-01 DIAGNOSIS — Z3041 Encounter for surveillance of contraceptive pills: Secondary | ICD-10-CM

## 2021-05-04 ENCOUNTER — Telehealth: Payer: Self-pay | Admitting: *Deleted

## 2021-05-04 NOTE — Telephone Encounter (Signed)
Pt called to office for refill on BC. Pt made aware cannot fill at this time as she has not been seen since 2019.  Pt states understanding.

## 2023-01-10 ENCOUNTER — Other Ambulatory Visit (HOSPITAL_COMMUNITY)
Admission: RE | Admit: 2023-01-10 | Discharge: 2023-01-10 | Disposition: A | Discharge status: Home / Self Care | Payer: No Typology Code available for payment source | Source: Ambulatory Visit | Attending: Obstetrics | Admitting: Obstetrics

## 2023-01-10 ENCOUNTER — Encounter: Payer: Self-pay | Admitting: Obstetrics

## 2023-01-10 ENCOUNTER — Ambulatory Visit (INDEPENDENT_AMBULATORY_CARE_PROVIDER_SITE_OTHER): Payer: No Typology Code available for payment source | Admitting: Obstetrics

## 2023-01-10 VITALS — BP 148/85 | HR 53 | Ht 63.0 in | Wt 158.1 lb

## 2023-01-10 DIAGNOSIS — Z1239 Encounter for other screening for malignant neoplasm of breast: Secondary | ICD-10-CM

## 2023-01-10 DIAGNOSIS — Z01419 Encounter for gynecological examination (general) (routine) without abnormal findings: Secondary | ICD-10-CM | POA: Diagnosis present

## 2023-01-10 DIAGNOSIS — N946 Dysmenorrhea, unspecified: Secondary | ICD-10-CM | POA: Diagnosis not present

## 2023-01-10 DIAGNOSIS — L900 Lichen sclerosus et atrophicus: Secondary | ICD-10-CM

## 2023-01-10 DIAGNOSIS — Z3041 Encounter for surveillance of contraceptive pills: Secondary | ICD-10-CM

## 2023-01-10 DIAGNOSIS — L989 Disorder of the skin and subcutaneous tissue, unspecified: Secondary | ICD-10-CM

## 2023-01-10 MED ORDER — NORGESTIMATE-ETH ESTRADIOL 0.25-35 MG-MCG PO TABS: 1.0000 | ORAL_TABLET | Freq: Every day | ORAL | 11 refills | Status: DC

## 2023-01-10 MED ORDER — IBUPROFEN 800 MG PO TABS: 800.0000 mg | ORAL_TABLET | Freq: Three times a day (TID) | ORAL | 5 refills | Status: AC | PRN

## 2023-01-10 MED ORDER — CLOBETASOL PROPIONATE 0.05 % EX CREA: 1.0000 | TOPICAL_CREAM | Freq: Two times a day (BID) | CUTANEOUS | 2 refills | Status: AC

## 2023-01-10 NOTE — Progress Notes (Signed)
Pt presents for AEX. Pt taking birth control pill. Requesting Rx to continue pills and refill of clobetasol. Last PAP 2019. No mammogram. Declined STD testing. Pt c/o itchy rash under breast. Pt reports bump on face, requesting referral.

## 2023-01-10 NOTE — Progress Notes (Unsigned)
Subjective:        Tonya Gray is a 42 y.o. female here for a routine exam.  Current complaints: none.    Personal health questionnaire:  Is patient [name], have a family history of breast and/or ovarian cancer: no Is there a family history of uterine cancer diagnosed at age < 7, gastrointestinal cancer, urinary tract cancer, family member who is a Field seismologist syndrome-associated carrier: no Is the patient overweight and hypertensive, family history of diabetes, personal history of gestational diabetes, preeclampsia or PCOS: no Is patient over 64, have PCOS,  family history of premature CHD under age 35, diabetes, smoke, have hypertension or peripheral artery disease:  no At any time, has a partner hit, kicked or otherwise hurt or frightened you?: no Over the past 2 weeks, have you felt down, depressed or hopeless?: no Over the past 2 weeks, have you felt little interest or pleasure in doing things?:no   Gynecologic History Patient's last menstrual period was 12/26/2022. Contraception: OCP (estrogen/progesterone) Last Pap: 2019. Results were: normal Last mammogram: none. Results were: none  Obstetric History OB History  Gravida Para Term Preterm AB Living  5 5 4 1 $ 0 4  SAB IAB Ectopic Multiple Live Births  0 0 0 0 4    # Outcome Date GA Lbr Len/2nd Weight Sex Delivery Anes PTL Lv  5 Preterm 07/19/14 [redacted]w[redacted]d  F CS-LTranv Gen    4 Term 10/28/12 [id number] / 00:21 7 lb 2.6 oz (3.249 kg) F Vag-Spont None  LIV     Birth Comments: wnl  3 Term 2007 [id number] 6 lb 12 oz (3.062 kg) F Vag-Spont None  LIV  2 Term 2005 [id number] 6 lb 15 oz (3.147 kg) M Vag-Spont   LIV  1 Term 2003 [id number] 5 lb 13 oz (2.637 kg) M Vag-Spont   LIV    Past Medical History:  Diagnosis Date   Medical history non-contributory    No pertinent past medical history     Past Surgical History:  Procedure Laterality Date   CESAREAN SECTION N/A 07/19/2014   Procedure: CESAREAN SECTION;   Surgeon: CharlShelly Bombard  [facility]  Service: Obstetrics;  Laterality: N/A;   NO PAST SURGERIES     WISDOM TOOTH EXTRACTION       Current Outpatient Medications:    clobetasol cream (TEMOVATE) 0.05 [id number] 1 Application topically 2 (two) times daily., Disp: 60 g, Rfl: 2   ibuprofen (ADVIL) 800 MG tablet, Take 1 tablet (800 mg total) by mouth every 8 (eight) hours as needed., Disp: 30 tablet, Rfl: 5   MONO-LINYAH 0.25-35 MG-MCG tablet, Take 1 tablet by mouth once daily (Patient not taking: Reported on 01/10/2023), Disp: 28 tablet, Rfl: 11   norgestimate-ethinyl estradiol (SPRINTEC 28) 0.25-35 MG-MCG tablet, Take 1 tablet by mouth daily., Disp: 28 tablet, Rfl: 11   tinidazole (TINDAMAX) 500 MG tablet, Take 2 tablets (1,000 mg total) by mouth daily with breakfast. (Patient not taking: Reported on 01/10/2023), Disp: 10 tablet, Rfl: 2 No Known Allergies  Social History   Tobacco Use   Smoking status: Never   Smokeless tobacco: Never  Substance Use Topics   Alcohol use: Yes    Alcohol/week: 0.0 standard drinks of alcohol    Comment: Occassionally     Family History  Problem Relation Age of Onset   Hypertension Mother    Diabetes Mother    Cancer Brother    Sickle cell trait Daughter  Other Daughter        shortened ulna   Sickle cell trait Son    Sickle cell trait Daughter    Cancer Maternal Grandmother    Anesthesia problems Neg Hx       Review of Systems  Constitutional: negative for fatigue and weight loss Respiratory: negative for cough and wheezing Cardiovascular: negative for chest pain, fatigue and palpitations Gastrointestinal: negative for abdominal pain and change in bowel habits Musculoskeletal:negative for myalgias Neurological: negative for gait problems and tremors Behavioral/Psych: negative for abusive relationship, depression Endocrine: negative for temperature intolerance    Genitourinary:negative for abnormal menstrual periods, genital lesions,  hot flashes, sexual problems and vaginal discharge Integument/breast: negative for breast lump, breast tenderness, nipple discharge and skin lesion(s)    Objective:       BP (!) 148/85   Pulse (!) 53   Ht 5' 3"$  (1.6 m)   Wt 158 lb 1.6 oz (71.7 kg)   LMP 12/26/2022   BMI 28.01 kg/m  General:   Alert and no distress  Skin:   no rash or abnormalities  Lungs:   clear to auscultation bilaterally  Heart:   regular rate and rhythm, S1, S2 normal, no murmur, click, rub or gallop  Breasts:   normal without suspicious masses, skin or nipple changes or axillary nodes  Abdomen:  normal findings: no organomegaly, soft, non-tender and no hernia  Pelvis:  External genitalia: normal general appearance Urinary system: urethral meatus normal and bladder without fullness, nontender Vaginal: normal without tenderness, induration or masses Cervix: normal appearance Adnexa: normal bimanual exam Uterus: anteverted and non-tender, normal size   Lab Review Urine pregnancy test Labs reviewed yes Radiologic studies reviewed no  I have spent a total of 20 minutes of face-to-face time, excluding clinical staff time, reviewing notes and preparing to see patient, ordering tests and/or medications, and counseling the patient.   Assessment:    1. Encounter for routine gynecological examination with Papanicolaou smear of cervix Rx: - Cytology - PAP( [facility])  2. Dysmenorrhea Rx: - ibuprofen (ADVIL) 800 MG tablet; Take 1 tablet (800 mg total) by mouth every 8 (eight) hours as needed.  Dispense: 30 tablet; Refill: 5  3. Encounter for surveillance of contraceptive pills Rx: - norgestimate-ethinyl estradiol ([place] 28) 0.25-35 MG-MCG tablet; Take 1 tablet by mouth daily.  Dispense: 28 tablet; Refill: 11  4. Screening breast examination Rx: - MM 3D SCREEN BREAST BILATERAL; Future  5. Lichen sclerosus Rx: - clobetasol cream (TEMOVATE) 0.05 %; Apply 1 Application topically 2 (two) times daily.   Dispense: 60 g; Refill: 2  6. Facial skin lesion Rx: - Ambulatory referral to Dermatology     Plan:    Education reviewed: calcium supplements, depression evaluation, low fat, low cholesterol diet, safe sex/STD prevention, self breast exams, and weight bearing exercise. Contraception: OCP (estrogen/progesterone). Mammogram ordered. Follow up in: 1 year.   Meds ordered this encounter  Medications   norgestimate-ethinyl estradiol (SPRINTEC 28) 0.25-35 MG-MCG tablet    Sig: Take 1 tablet by mouth daily.    Dispense:  28 tablet    Refill:  11   clobetasol cream (TEMOVATE) 0.05 %    Sig: Apply 1 Application topically 2 (two) times daily.    Dispense:  60 g    Refill:  2   ibuprofen (ADVIL) 800 MG tablet    Sig: Take 1 tablet (800 mg total) by mouth every 8 (eight) hours as needed.    Dispense:  30 tablet  Refill:  5   Orders Placed This Encounter  Procedures   MM 3D SCREEN BREAST BILATERAL    Standing Status:   Future    Standing Expiration Date:   01/11/2024    Order Specific Question:   Reason for Exam (SYMPTOM  OR DIAGNOSIS REQUIRED)    Answer:   Screening    Order Specific Question:   Is the patient pregnant?    Answer:   No    Order Specific Question:   Preferred imaging location?    Answer:   [facility]    Order Specific Question:   Release to patient    Answer:   Immediate   Ambulatory referral to Dermatology    Referral Priority:   Routine    Referral Type:   Consultation    Referral Reason:   Specialty Services Required    Requested Specialty:   Dermatology    Number of Visits Requested:   1    Shelly Bombard, MD 01/15/2023 3:11 PM

## 2023-01-16 ENCOUNTER — Ambulatory Visit (HOSPITAL_BASED_OUTPATIENT_CLINIC_OR_DEPARTMENT_OTHER)
Admission: RE | Admit: 2023-01-16 | Discharge: 2023-01-16 | Disposition: A | Discharge status: Home / Self Care | Payer: No Typology Code available for payment source | Source: Ambulatory Visit | Attending: Obstetrics | Admitting: Obstetrics

## 2023-01-16 ENCOUNTER — Encounter (HOSPITAL_BASED_OUTPATIENT_CLINIC_OR_DEPARTMENT_OTHER): Payer: Self-pay

## 2023-01-16 DIAGNOSIS — Z1231 Encounter for screening mammogram for malignant neoplasm of breast: Secondary | ICD-10-CM | POA: Diagnosis not present

## 2023-01-16 DIAGNOSIS — R92323 Mammographic fibroglandular density, bilateral breasts: Secondary | ICD-10-CM | POA: Insufficient documentation

## 2023-01-16 DIAGNOSIS — N6489 Other specified disorders of breast: Secondary | ICD-10-CM | POA: Insufficient documentation

## 2023-01-16 DIAGNOSIS — Z1239 Encounter for other screening for malignant neoplasm of breast: Secondary | ICD-10-CM | POA: Diagnosis present

## 2023-01-16 LAB — CYTOLOGY - PAP
Comment: NEGATIVE
Diagnosis: NEGATIVE
Diagnosis: REACTIVE
High risk HPV: NEGATIVE

## 2023-01-20 ENCOUNTER — Other Ambulatory Visit: Payer: Self-pay | Admitting: Obstetrics

## 2023-01-20 DIAGNOSIS — R928 Other abnormal and inconclusive findings on diagnostic imaging of breast: Secondary | ICD-10-CM

## 2023-03-25 ENCOUNTER — Ambulatory Visit
Admission: RE | Admit: 2023-03-25 | Discharge: 2023-03-25 | Disposition: A | Discharge status: Home / Self Care | Payer: No Typology Code available for payment source | Source: Ambulatory Visit | Attending: Obstetrics | Admitting: Obstetrics

## 2023-03-25 DIAGNOSIS — R928 Other abnormal and inconclusive findings on diagnostic imaging of breast: Secondary | ICD-10-CM

## 2023-04-15 ENCOUNTER — Other Ambulatory Visit (HOSPITAL_BASED_OUTPATIENT_CLINIC_OR_DEPARTMENT_OTHER): Payer: Self-pay

## 2023-04-15 ENCOUNTER — Encounter (HOSPITAL_BASED_OUTPATIENT_CLINIC_OR_DEPARTMENT_OTHER): Payer: Self-pay

## 2023-04-15 ENCOUNTER — Other Ambulatory Visit: Payer: Self-pay

## 2023-04-15 ENCOUNTER — Emergency Department (HOSPITAL_BASED_OUTPATIENT_CLINIC_OR_DEPARTMENT_OTHER)
Admission: EM | Admit: 2023-04-15 | Discharge: 2023-04-15 | Disposition: A | Discharge status: Home / Self Care | Payer: No Typology Code available for payment source | Attending: Emergency Medicine | Admitting: Emergency Medicine

## 2023-04-15 DIAGNOSIS — K047 Periapical abscess without sinus: Secondary | ICD-10-CM | POA: Diagnosis not present

## 2023-04-15 DIAGNOSIS — K0889 Other specified disorders of teeth and supporting structures: Secondary | ICD-10-CM | POA: Diagnosis present

## 2023-04-15 MED ORDER — NAPROXEN 500 MG PO TABS: 500.0000 mg | ORAL_TABLET | Freq: Two times a day (BID) | ORAL | 0 refills | Status: AC

## 2023-04-15 MED ORDER — IBUPROFEN 400 MG PO TABS
600.0000 mg | ORAL_TABLET | Freq: Once | ORAL | Status: AC
  Administered 2023-04-15: 600 mg via ORAL
  Filled 2023-04-15: qty 1

## 2023-04-15 MED ORDER — CLINDAMYCIN HCL 300 MG PO CAPS: 300.0000 mg | ORAL_CAPSULE | Freq: Four times a day (QID) | ORAL | 0 refills | Status: AC

## 2023-04-15 MED ORDER — CLINDAMYCIN HCL 150 MG PO CAPS
300.0000 mg | ORAL_CAPSULE | Freq: Once | ORAL | Status: AC
  Administered 2023-04-15: 300 mg via ORAL
  Filled 2023-04-15: qty 2

## 2023-04-15 MED ORDER — ACETAMINOPHEN 500 MG PO TABS: 500.0000 mg | ORAL_TABLET | Freq: Four times a day (QID) | ORAL | 0 refills | Status: AC | PRN

## 2023-04-15 NOTE — ED Provider Notes (Signed)
[place] EMERGENCY DEPARTMENT AT [facility] Provider Note   CSN: [id number] Arrival date & time: 04/15/23  1035     History  Chief Complaint  Patient presents with   Dental Pain    Tonya Gray is a 42 y.o. female.  HPI    42 year old female comes in with chief complaint of facial swelling.  Patient states that she had gone to a dentist to get her top right molar pulled out about a month or 2 ago.  At that time there was complication with anesthesia, patient was advised to follow-up with oral surgery but she has been scared to do so.  She was initially given amoxicillin, but had allergic reaction and it was discontinued.  She was fine until this morning when she woke up and noted facial swelling.  She is also having throbbing pain over the right upper palate.  Home Medications Prior to Admission medications   Medication Sig Start Date End Date Taking? Authorizing Provider  acetaminophen (TYLENOL) 500 MG tablet Take 1 tablet (500 mg total) by mouth every 6 (six) hours as needed. 04/15/23  Yes Derwood Kaplan, MD  clindamycin (CLEOCIN) 300 MG capsule Take 1 capsule (300 mg total) by mouth 4 (four) times daily. 04/15/23  Yes Keylee Shrestha, MD  naproxen (NAPROSYN) 500 MG tablet Take 1 tablet (500 mg total) by mouth 2 (two) times daily. 04/15/23  Yes Derwood Kaplan, MD  clobetasol cream (TEMOVATE) 0.05 % Apply 1 Application topically 2 (two) times daily. 01/10/23   [name], MD  ibuprofen (ADVIL) 800 MG tablet Take 1 tablet (800 mg total) by mouth every 8 (eight) hours as needed. 01/10/23   [name], MD  [facility] 0.25-35 MG-MCG tablet Take 1 tablet by mouth once daily Patient not taking: Reported on 01/10/2023 03/13/20   [name], MD  norgestimate-ethinyl estradiol (SPRINTEC 28) 0.25-35 MG-MCG tablet Take 1 tablet by mouth daily. 01/10/23   [name], MD  tinidazole (TINDAMAX) 500 MG tablet Take 2 tablets (1,000 mg total) by mouth daily with  breakfast. Patient not taking: Reported on 01/10/2023 12/27/16   [name], MD      Allergies    Amoxicillin    Review of Systems   Review of Systems  Physical Exam Updated Vital Signs BP (!) 141/88 (BP Location: Right Arm)   Pulse 73   Temp 97.8 F (36.6 C)   Resp 16   SpO2 100%  Physical Exam Vitals and nursing note reviewed.  Constitutional:      General: She is not in acute distress.    Appearance: She is well-developed. She is not toxic-appearing.  HENT:     Head: Atraumatic.     Mouth/Throat:     Comments: Patient has tenderness over right upper molar region.  There is no gingival abscess.  Negative trismus, negative crepitus. Patient has clear evidence of right facial swelling/edema.  No drooling Cardiovascular:     Rate and Rhythm: Normal rate.  Pulmonary:     Effort: Pulmonary effort is normal.  Musculoskeletal:     Cervical back: Normal range of motion and neck supple.  Skin:    General: Skin is warm and dry.  Neurological:     Mental Status: She is alert and oriented to person, place, and time.     ED Results / Procedures / Treatments   Labs (all labs ordered are listed, but only abnormal results are displayed) Labs Reviewed - No data to display  EKG None  Radiology No results found.  Procedures Procedures    Medications Ordered in ED Medications  clindamycin (CLEOCIN) capsule 300 mg (300 mg Oral Given 04/15/23 1318)  ibuprofen (ADVIL) tablet 600 mg (600 mg Oral Given 04/15/23 1318)    ED Course/ Medical Decision Making/ A&P                             Medical Decision Making Risk OTC drugs. Prescription drug management.  42 year old female with known history of dental disease in the right upper quadrant comes in with chief complaint of tooth pain and facial swelling.  Patient is immunocompetent, nontoxic-appearing and has no red flags suggesting severe deep space swelling/infection that is impacting her ability to swallow or  breathe.  DDx includes: - Periapical tooth infection - Dental abscess - Gingivitis - Pulpitis - Nerve root compression -Facial abscess  Plan is to initiate clindamycin.  She is allergic to amoxicillin. She was given a oral surgeon referral, advised her to call them immediately for a close follow-up.  Unfortunately there is no on-call oral surgeon.  Return precautions also discussed.  Will give her first dose of clindamycin now.  Final Clinical Impression(s) / ED Diagnoses Final diagnoses:  Dental infection    Rx / DC Orders ED Discharge Orders          Ordered    clindamycin (CLEOCIN) 300 MG capsule  4 times daily        04/15/23 1326    naproxen (NAPROSYN) 500 MG tablet  2 times daily        04/15/23 1326    acetaminophen (TYLENOL) 500 MG tablet  Every 6 hours PRN        04/15/23 1326              Derwood Kaplan, MD 04/15/23 1330

## 2023-04-15 NOTE — Discharge Instructions (Addendum)
We saw in the emergency room for facial swelling.  Most likely the cause is dental infection.  We recommend that he call the oral surgeon today for a prompt follow-up.  Take clindamycin as prescribed.  Take Naprosyn and Tylenol for pain control.  Return to the emergency room if you start having worsening pain, difficulty in breathing, swallowing, drooling.

## 2023-04-15 NOTE — ED Triage Notes (Signed)
Pt c/o "issue w a tooth- R upper molar- x44mos." States she went to get it pulled 2mos ago, "had allergic reaction to whatever she shot me up w." States "I have been afraid to go see someone since."  Has been prescribed amoxicillin, "I had an allergic reaction to that, too."   Hoping to get new abx & pain mgmt until she can get tooth pulled

## 2024-01-28 ENCOUNTER — Other Ambulatory Visit: Payer: Self-pay

## 2024-01-28 DIAGNOSIS — Z3041 Encounter for surveillance of contraceptive pills: Secondary | ICD-10-CM

## 2024-01-28 MED ORDER — NORGESTIMATE-ETH ESTRADIOL 0.25-35 MG-MCG PO TABS
1.0000 | ORAL_TABLET | Freq: Every day | ORAL | 0 refills | Status: AC
Start: 2024-01-28 — End: ?

## 2024-01-28 NOTE — Progress Notes (Signed)
 Pt called for refill on BC. Sent per protocol. Schedulers will call pt to set up appt for annual.

## 2024-03-16 ENCOUNTER — Ambulatory Visit: Payer: No Typology Code available for payment source | Admitting: Obstetrics
